# Patient Record
Sex: Male | Born: 1955
Health system: Southern US, Community
[De-identification: ages and names within clinical notes are randomized; demographics above are authoritative.]

## PROBLEM LIST (undated history)

## (undated) ENCOUNTER — Inpatient Hospital Stay: Admission: EM | Payer: Self-pay | Source: Home / Self Care

## (undated) DIAGNOSIS — Z974 Presence of external hearing-aid: Secondary | ICD-10-CM

## (undated) DIAGNOSIS — Z8719 Personal history of other diseases of the digestive system: Secondary | ICD-10-CM

## (undated) DIAGNOSIS — E7800 Pure hypercholesterolemia, unspecified: Secondary | ICD-10-CM

## (undated) DIAGNOSIS — Z87448 Personal history of other diseases of urinary system: Secondary | ICD-10-CM

## (undated) DIAGNOSIS — E039 Hypothyroidism, unspecified: Secondary | ICD-10-CM

## (undated) DIAGNOSIS — K649 Unspecified hemorrhoids: Secondary | ICD-10-CM

## (undated) DIAGNOSIS — Z8619 Personal history of other infectious and parasitic diseases: Secondary | ICD-10-CM

## (undated) DIAGNOSIS — Z973 Presence of spectacles and contact lenses: Secondary | ICD-10-CM

## (undated) DIAGNOSIS — Z8679 Personal history of other diseases of the circulatory system: Secondary | ICD-10-CM

## (undated) DIAGNOSIS — I10 Essential (primary) hypertension: Secondary | ICD-10-CM

---

## 1998-04-04 ENCOUNTER — Ambulatory Visit (HOSPITAL_COMMUNITY): Admission: RE | Admit: 1998-04-04 | Discharge: 1998-04-04 | Payer: Self-pay | Admitting: Orthopedic Surgery

## 2000-06-26 ENCOUNTER — Encounter: Payer: Self-pay | Admitting: *Deleted

## 2000-06-26 ENCOUNTER — Encounter: Admission: RE | Admit: 2000-06-26 | Discharge: 2000-06-26 | Payer: Self-pay | Admitting: *Deleted

## 2002-12-31 ENCOUNTER — Encounter: Admission: RE | Admit: 2002-12-31 | Discharge: 2002-12-31 | Payer: Self-pay | Admitting: Family Medicine

## 2002-12-31 ENCOUNTER — Encounter: Payer: Self-pay | Admitting: Family Medicine

## 2003-11-13 DIAGNOSIS — Z8719 Personal history of other diseases of the digestive system: Secondary | ICD-10-CM

## 2003-11-13 HISTORY — DX: Personal history of other diseases of the digestive system: Z87.19

## 2003-11-14 ENCOUNTER — Encounter: Payer: Self-pay | Admitting: Emergency Medicine

## 2003-11-14 ENCOUNTER — Inpatient Hospital Stay (HOSPITAL_COMMUNITY): Admission: EM | Admit: 2003-11-14 | Discharge: 2003-11-25 | Payer: Self-pay | Admitting: Emergency Medicine

## 2003-11-22 ENCOUNTER — Encounter (INDEPENDENT_AMBULATORY_CARE_PROVIDER_SITE_OTHER): Payer: Self-pay | Admitting: *Deleted

## 2005-02-23 ENCOUNTER — Encounter (INDEPENDENT_AMBULATORY_CARE_PROVIDER_SITE_OTHER): Payer: Self-pay | Admitting: *Deleted

## 2005-02-23 ENCOUNTER — Ambulatory Visit (HOSPITAL_COMMUNITY): Admission: RE | Admit: 2005-02-23 | Discharge: 2005-02-23 | Payer: Self-pay | Admitting: *Deleted

## 2005-10-14 ENCOUNTER — Emergency Department (HOSPITAL_COMMUNITY): Admission: EM | Admit: 2005-10-14 | Discharge: 2005-10-14 | Payer: Self-pay | Admitting: *Deleted

## 2006-05-02 ENCOUNTER — Encounter: Admission: RE | Admit: 2006-05-02 | Discharge: 2006-05-02 | Payer: Self-pay | Admitting: Family Medicine

## 2010-10-21 ENCOUNTER — Emergency Department (HOSPITAL_COMMUNITY)
Admission: EM | Admit: 2010-10-21 | Discharge: 2010-10-21 | Payer: Self-pay | Source: Home / Self Care | Admitting: Emergency Medicine

## 2011-03-23 NOTE — H&P (Signed)
NAME:  Lonnie Jackson, Lonnie Jackson                         ACCOUNT NO.:  [id number]   MEDICAL RECORD NO.:  [id number]                   PATIENT TYPE:   LOCATION:                                       FACILITY:   PHYSICIAN:  Candyce Churn, M.D.          DATE OF BIRTH:  Feb 19, 1956   DATE OF ADMISSION:  11/14/2003  DATE OF DISCHARGE:                                HISTORY & PHYSICAL   CHIEF COMPLAINT:  Abdominal pain and nausea and vomiting.   HISTORY OF PRESENT ILLNESS:  Lonnie Jackson is a pleasant 55 year old male with a  sudden onset of nausea and vomiting yesterday a.m. following a meal at  [facility].  He began having mid abdominal pain.  He went to [facility] who  referred him on to [facility] where he spent last night in the  emergency room being treated with narcotics and antiemetics and was felt to  likely have a gastroenteritis.  Abdominal ultrasound revealed no evidence of  gallstones or cholecystitis by Dr. Pati Gallo ER report.  Abdominal ultrasound  is not currently available.  His lipase was normal during that evaluation on  November 13, 2003.  He was sent home in improved condition but on returning  home he developed more abdominal pain and vomiting and returned to the  [facility] Emergency Room where his evaluation revealed a lipase now  elevated at 85 and amylase of 185.  He is admitted now to treat and further  workup pancreatitis.   ALLERGIES:  None.   MEDICATIONS:  None.   PAST MEDICAL HISTORY:  1. Hepatitis C infection secondary to IV drug use more than 20 years ago.     He began a 6 months of therapy in December 2001 with interferon and     ribavirin and has had clearing of his virus by history.  Dr. Roosvelt Harps assisted with this therapy.  2. Tobacco abuse for 30 years now.  He stopped 4 months ago.   PAST SURGICAL HISTORY:  None.   FAMILY HISTORY:  Hypertension, diabetes, and heart disease in his mother and  he never knew his father.  Twin sister  is healthy and he has a brother with  hypertension and asthma.   SOCIAL HISTORY:  The patient is married, has 2 children from a previous  marriage.  His wife, Sonny Masters, is very supportive and can be reached at home,  phone [phone].  He works at a [employer].   HABITS:  No alcohol use.  IV drug use more than 20 years ago but none since  and he stopped tobacco 4 months ago after 30 years of use.   REVIEW OF SYSTEMS:  Denies diarrhea, bright red blood per rectum, melena, or  coffee-ground emesis.  He has had some chills.  He does not have a history  of renal calculi or gallstones.  Denies headache, chest pain, shortness of  breath.  He does have bilateral flank pain left greater than right and  diffuse abdominal pain.  Denies hematuria.  His neck is not stiff or  painful.  He has never had a similar syndrome.   PHYSICAL EXAMINATION:  GENERAL:  A middle-age male sedated from Dilaudid.  He complains of pain across his mid abdomen to his back and both flanks.  VITAL SIGNS:  His blood pressure is elevated at 149/102, temperature 97.4,  respiratory rate 20 and nonlabored.  O2 saturation 96% on room air.  HEENT:  Reveals a parched mouth.  NECK:  Supple.  No obvious JVD or thyromegaly.  CHEST:  Clear to auscultation.  CARDIAC:  Regular rhythm without murmurs or gallops.  ABDOMEN:  Soft with guarding to palpation diffusely.  More tender in the  lateral abdomen bilaterally.  He has percussion pain in the left flank  slightly greater than right flank.  His bowel sounds are diminished.  There  is no obvious rebound.  RECTAL:  Reveals no stool present.  Prostate is mildly enlarged but  nontender and non-nodular.  EXTREMITIES:  Without cyanosis, clubbing, or edema.  NEUROLOGICAL:  Reveals him to be oriented x3, nonfocal.  SKIN:  Without rashes.   Abdominal and pelvic CT scan performed in the [facility] Emergency Room on  November 14, 2003 reveals some left perinephric edema and an  enlarged  prostate, otherwise no obvious abnormalities.  Abdominal ultrasound from  November 13, 2003 per Dr. Pati Gallo note is reported as negative gallbladder.  White cell count is 11,700, 80% polys.  Hemoglobin 15.3, platelet count  180,000.  Sodium 135, potassium 4.2, chloride 102, bicarb 29, BUN 15,  creatinine 1.4, glucose 101, alk phos is 45 and was 46 yesterday, SGOT is  115 and was 81 yesterday, SGPT is 160 and was 122 yesterday, total bilirubin  is 1, amylase is 185 and lipase is 85 and was 18 on November 13, 2003.  Urinalysis has 15 ketones, protein 100, rare bacteria, 0-2 red cells and  white cells, bilirubin is positive.   ASSESSMENT:  A 55 year old male with 36 hours of abdominal pain and labs  have indicated pancreatitis and mild hepatitis.  His CT suggests left  perinephric inflammation.  I wonder if he may have passed a gallstone to  cause pancreatitis, (?) I doubt pyelonephritis with 0-2 white cells in his  urine.  Prostate is nontender.  I wonder if he may have  hypertriglyceridemia.  Serum not reported as lipemic, however.  On no  medications.  Does not have left shift to suggest bacterial process as yet.  Perinephric edema may be secondary to pancreatitis.   PLAN:  1. Admit and hydrate.  2. Treat with IV Dilaudid and Phenergan as needed to control pain and nausea     and vomiting.  3. Repeat labs in the a.m.  4. Have Dr. Luther Parody to follow.  5. NG suction if nausea and vomiting is severe.  6. Monitor O2 saturation and follow temperature and CBC.  7. Culture urine and blood culture x2 if temperature is greater than or     equal to 101 degrees.                                               Candyce Churn, M.D.    RNG/MEDQ  D:  11/14/2003  T:  11/14/2003  Job:  [id number]   cc:   Donia Guiles, M.D.  [address] [place]  [place] [address]  Fax: [phone]   Althea Grimmer. Luther Parody, M.D.  [address], [address]  [place]  [place] [address] Fax:  [phone]   Melissa L. Ladona Ridgel, MD  [address] [place], [place] [address]  Fax: [phone]
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
## 2011-03-23 NOTE — Consult Note (Signed)
Lonnie Jackson, Lonnie Jackson                         ACCOUNT NO.:  [id number]   MEDICAL RECORD NO.:  [id number]                   PATIENT TYPE:  INP   LOCATION:  0477                                 FACILITY:  [facility]   PHYSICIAN:  Bertram Millard. Dahlstedt, M.D.          DATE OF BIRTH:  May 18, 1956   DATE OF CONSULTATION:  11/20/2003  DATE OF DISCHARGE:                                   CONSULTATION   REASON FOR CONSULTATION:  Left renal infarct.   BRIEF HISTORY:  This 55 year old male with no significant past medical  history was admitted on the 9th of this month.  He had a 24-hour history of  sudden onset of nausea and vomiting as well as lower abdominal pain.  The  patient initially was sent home from the emergency room with presumed  gastroenteritis.  The pain and nausea and vomiting continued and he returned  for a further evaluation and management.  At first, he was thought to have  possible pancreatitis or pyelonephritis based on some perinephric and  peripancreatic inflammation.  His lipase and amylase were 85 and 185,  respectively.   Since that time, follow up CT scan revealed the patient to have infarcts in  the left kidney.  He has had basically resolution of his left flank pain and  nausea and vomiting.  He recently had a CT of his head, performed today, due  to a month long history if headache.  The headache was usually worse in the  middle of the night.  He took ibuprofen for that.   The patient does have an intermittent history of gross hematuria.  He says  this is mainly terminal in nature.  He denies passing blood clots.  He  denies any prior flank pain, history of kidney stones, prostate, bladder or  kidney infections.  His only GU history dates back to a vasectomy several  years ago.   The patient had a past medical history significant for hepatitis C infection  due to IV drug abuse.  He quit use more than 20 years ago.  He sees Althea Grimmer.  Luther Parody, M.D. for that.   He  is not on any current medications.   There are no known drug allergies.   FAMILY HISTORY:  Negative for prostate cancer.  Is positive for heart  disease, diabetes, and hypertension.   PHYSICAL EXAMINATION:  GENERAL:  A pleasant, healthy-appearing, robust adult  male.  LYMPHATICS:  He had no supraclavicular adenopathy.  CHEST:  Lungs were clear to auscultation bilaterally.  HEART:  Regular rate and rhythm.  No rubs, gallops, or murmurs.  ABDOMEN:  Protuberant, soft, with mild left CVA and left upper quadrant  tenderness.  He had no hepatosplenomegaly or masses.  Bowel sounds were  present.  There was no rebound or guarding.  There were no inguinal hernias.  GENITOURINARY:  Phallus was circumcised.  There were several condylomata  present on the shaft  of the penis.  Glans was normal, meatus normal location  and size without blood or disease.  Scrotal skin unremarkable.  Testicles  were descended bilaterally.  No nodules or tenderness.  Cords and epididymal  structures were normal.  RECTAL:  Normal perianal and perineal skin.  Normal anal sphincter tone.  Gland 2+ in size.  No tenderness, no nodularity, no rectal masses.  Seminal  vesicles nonpalpable.   IMPRESSION:  1. Renal infarct.  The etiology undetermined.  I would be suspicious for     embolic events, the patient undergoing evaluation for that right now.     Would also rule out any type of vasculitis sort of process going on.  2. Hematuria, etiology undetermined.  CT reveals no renal lesions except for     recent infarct.  Will need a lower tract evaluation down the road.  Can     do this in the office with flexible cystoscopy.  3. Genital condylomata.  The patient desires treatment.  We will freeze     these in the office on followup.   Will follow this patient during his hospitalization.                                               Bertram Millard. Dahlstedt, M.D.    SMD/MEDQ  D:  11/20/2003  T:  11/20/2003  Job:  [id number]    cc:   Deirdre Peer. Polite, M.D.   Donia Guiles, M.D.  [address] [place]  [place] [address]  Fax: [phone]

## 2014-03-09 ENCOUNTER — Emergency Department (HOSPITAL_COMMUNITY): Payer: 59

## 2014-03-09 ENCOUNTER — Inpatient Hospital Stay (HOSPITAL_COMMUNITY)
Admission: EM | Admit: 2014-03-09 | Discharge: 2014-03-12 | DRG: 026 | Disposition: A | Discharge status: Home / Self Care | Payer: 59 | Attending: General Surgery | Admitting: General Surgery

## 2014-03-09 ENCOUNTER — Emergency Department (HOSPITAL_COMMUNITY): Payer: 59 | Admitting: Anesthesiology

## 2014-03-09 ENCOUNTER — Encounter (HOSPITAL_COMMUNITY): Payer: 59 | Admitting: Anesthesiology

## 2014-03-09 ENCOUNTER — Encounter (HOSPITAL_COMMUNITY): Admission: EM | Disposition: A | Discharge status: Home / Self Care | Payer: Self-pay | Source: Home / Self Care

## 2014-03-09 ENCOUNTER — Encounter (HOSPITAL_COMMUNITY): Payer: Self-pay | Admitting: Emergency Medicine

## 2014-03-09 DIAGNOSIS — G9600 Cerebrospinal fluid leak, unspecified: Secondary | ICD-10-CM | POA: Diagnosis present

## 2014-03-09 DIAGNOSIS — H9190 Unspecified hearing loss, unspecified ear: Secondary | ICD-10-CM | POA: Diagnosis present

## 2014-03-09 DIAGNOSIS — S022XXA Fracture of nasal bones, initial encounter for closed fracture: Secondary | ICD-10-CM

## 2014-03-09 DIAGNOSIS — I10 Essential (primary) hypertension: Secondary | ICD-10-CM | POA: Diagnosis present

## 2014-03-09 DIAGNOSIS — Z79899 Other long term (current) drug therapy: Secondary | ICD-10-CM

## 2014-03-09 DIAGNOSIS — S02400A Malar fracture unspecified, initial encounter for closed fracture: Secondary | ICD-10-CM

## 2014-03-09 DIAGNOSIS — E079 Disorder of thyroid, unspecified: Secondary | ICD-10-CM | POA: Diagnosis present

## 2014-03-09 DIAGNOSIS — S0121XA Laceration without foreign body of nose, initial encounter: Secondary | ICD-10-CM | POA: Diagnosis present

## 2014-03-09 DIAGNOSIS — S0100XA Unspecified open wound of scalp, initial encounter: Secondary | ICD-10-CM | POA: Diagnosis present

## 2014-03-09 DIAGNOSIS — Q019 Encephalocele, unspecified: Secondary | ICD-10-CM

## 2014-03-09 DIAGNOSIS — S01312A Laceration without foreign body of left ear, initial encounter: Secondary | ICD-10-CM | POA: Diagnosis present

## 2014-03-09 DIAGNOSIS — S060X9A Concussion with loss of consciousness of unspecified duration, initial encounter: Secondary | ICD-10-CM

## 2014-03-09 DIAGNOSIS — E7800 Pure hypercholesterolemia, unspecified: Secondary | ICD-10-CM | POA: Diagnosis present

## 2014-03-09 DIAGNOSIS — I609 Nontraumatic subarachnoid hemorrhage, unspecified: Secondary | ICD-10-CM | POA: Diagnosis present

## 2014-03-09 DIAGNOSIS — S06300A Unspecified focal traumatic brain injury without loss of consciousness, initial encounter: Principal | ICD-10-CM

## 2014-03-09 DIAGNOSIS — S01319A Laceration without foreign body of unspecified ear, initial encounter: Secondary | ICD-10-CM

## 2014-03-09 DIAGNOSIS — S066XAA Traumatic subarachnoid hemorrhage with loss of consciousness status unknown, initial encounter: Secondary | ICD-10-CM

## 2014-03-09 DIAGNOSIS — S01309A Unspecified open wound of unspecified ear, initial encounter: Secondary | ICD-10-CM | POA: Diagnosis present

## 2014-03-09 DIAGNOSIS — S0120XA Unspecified open wound of nose, initial encounter: Secondary | ICD-10-CM | POA: Diagnosis present

## 2014-03-09 DIAGNOSIS — T07XXXA Unspecified multiple injuries, initial encounter: Secondary | ICD-10-CM | POA: Diagnosis present

## 2014-03-09 DIAGNOSIS — S02109A Fracture of base of skull, unspecified side, initial encounter for closed fracture: Principal | ICD-10-CM | POA: Diagnosis present

## 2014-03-09 DIAGNOSIS — S0292XA Unspecified fracture of facial bones, initial encounter for closed fracture: Secondary | ICD-10-CM | POA: Diagnosis present

## 2014-03-09 DIAGNOSIS — Z1889 Other specified retained foreign body fragments: Secondary | ICD-10-CM

## 2014-03-09 DIAGNOSIS — R339 Retention of urine, unspecified: Secondary | ICD-10-CM | POA: Diagnosis not present

## 2014-03-09 DIAGNOSIS — S066X9A Traumatic subarachnoid hemorrhage with loss of consciousness of unspecified duration, initial encounter: Secondary | ICD-10-CM

## 2014-03-09 DIAGNOSIS — J342 Deviated nasal septum: Secondary | ICD-10-CM | POA: Diagnosis present

## 2014-03-09 DIAGNOSIS — S02401A Maxillary fracture, unspecified, initial encounter for closed fracture: Secondary | ICD-10-CM

## 2014-03-09 DIAGNOSIS — S060XAA Concussion with loss of consciousness status unknown, initial encounter: Secondary | ICD-10-CM

## 2014-03-09 HISTORY — PX: SINUS ENDO W/FUSION: SHX777

## 2014-03-09 HISTORY — PX: MINOR GRAFT APPLICATION: SHX5978

## 2014-03-09 HISTORY — PX: LACERATION REPAIR: SHX5284

## 2014-03-09 HISTORY — DX: Essential (primary) hypertension: I10

## 2014-03-09 HISTORY — DX: Pure hypercholesterolemia, unspecified: E78.00

## 2014-03-09 LAB — COMPREHENSIVE METABOLIC PANEL
ALBUMIN: 3.9 g/dL (ref 3.5–5.2)
ALK PHOS: 48 U/L (ref 39–117)
ALT: 43 U/L (ref 0–53)
AST: 41 U/L — AB (ref 0–37)
BUN: 20 mg/dL (ref 6–23)
CO2: 23 mEq/L (ref 19–32)
Calcium: 8.5 mg/dL (ref 8.4–10.5)
Chloride: 107 mEq/L (ref 96–112)
Creatinine, Ser: 1.02 mg/dL (ref 0.50–1.35)
GFR calc Af Amer: >90 mL/min (ref >90)
GFR calc non Af Amer: 80 mL/min — ABNORMAL LOW (ref >90)
GLUCOSE: 107 mg/dL — AB (ref 70–99)
POTASSIUM: 3.5 meq/L — AB (ref 3.7–5.3)
SODIUM: 144 meq/L (ref 137–147)
TOTAL PROTEIN: 6.9 g/dL (ref 6.0–8.3)
Total Bilirubin: 0.3 mg/dL (ref 0.3–1.2)

## 2014-03-09 LAB — CBC
HEMATOCRIT: 42 % (ref 39.0–52.0)
Hemoglobin: 15.2 g/dL (ref 13.0–17.0)
MCH: 30.6 pg (ref 26.0–34.0)
MCHC: 36.2 g/dL — AB (ref 30.0–36.0)
MCV: 84.5 fL (ref 78.0–100.0)
Platelets: 121 10*3/uL — ABNORMAL LOW (ref 150–400)
RBC: 4.97 MIL/uL (ref 4.22–5.81)
RDW: 12.9 % (ref 11.5–15.5)
WBC: 6.1 10*3/uL (ref 4.0–10.5)

## 2014-03-09 LAB — SAMPLE TO BLOOD BANK

## 2014-03-09 LAB — PROTIME-INR
INR: 1.02 (ref 0.00–1.49)
Prothrombin Time: 13.2 seconds (ref 11.6–15.2)

## 2014-03-09 SURGERY — MINOR GRAFT APPLICATION
Anesthesia: General | Site: Nose

## 2014-03-09 MED ORDER — SUCCINYLCHOLINE CHLORIDE 20 MG/ML IJ SOLN
INTRAMUSCULAR | Status: AC
  Filled 2014-03-09: qty 1

## 2014-03-09 MED ORDER — CLINDAMYCIN PHOSPHATE 600 MG/50ML IV SOLN
600.0000 mg | Freq: Once | INTRAVENOUS | Status: AC
  Administered 2014-03-09: 600 mg via INTRAVENOUS
  Filled 2014-03-09: qty 50

## 2014-03-09 MED ORDER — PROMETHAZINE HCL 25 MG/ML IJ SOLN
12.5000 mg | INTRAMUSCULAR | Status: DC | PRN
  Administered 2014-03-09 – 2014-03-10 (×2): 12.5 mg via INTRAVENOUS
  Filled 2014-03-09: qty 1

## 2014-03-09 MED ORDER — ONDANSETRON HCL 4 MG/2ML IJ SOLN: 4.0000 mg | Freq: Once | INTRAMUSCULAR | Status: DC

## 2014-03-09 MED ORDER — ONDANSETRON HCL 4 MG/2ML IJ SOLN
4.0000 mg | Freq: Once | INTRAMUSCULAR | Status: AC
  Administered 2014-03-09: 4 mg via INTRAVENOUS

## 2014-03-09 MED ORDER — TETANUS-DIPHTH-ACELL PERTUSSIS 5-2.5-18.5 LF-MCG/0.5 IM SUSP
0.5000 mL | Freq: Once | INTRAMUSCULAR | Status: AC
  Administered 2014-03-09: 0.5 mL via INTRAMUSCULAR

## 2014-03-09 MED ORDER — OXYMETAZOLINE HCL 0.05 % NA SOLN
NASAL | Status: AC
  Filled 2014-03-09: qty 15

## 2014-03-09 MED ORDER — LIDOCAINE HCL (CARDIAC) 20 MG/ML IV SOLN
INTRAVENOUS | Status: AC
  Filled 2014-03-09: qty 5

## 2014-03-09 MED ORDER — MORPHINE SULFATE 2 MG/ML IJ SOLN
2.0000 mg | Freq: Once | INTRAMUSCULAR | Status: AC
  Administered 2014-03-09: 2 mg via INTRAVENOUS

## 2014-03-09 MED ORDER — ARTIFICIAL TEARS OP OINT
TOPICAL_OINTMENT | OPHTHALMIC | Status: AC
  Filled 2014-03-09: qty 3.5

## 2014-03-09 MED ORDER — PROPOFOL 10 MG/ML IV BOLUS
INTRAVENOUS | Status: AC
  Filled 2014-03-09: qty 20

## 2014-03-09 MED ORDER — ALBUMIN HUMAN 5 % IV SOLN
INTRAVENOUS | Status: DC | PRN
  Administered 2014-03-09 – 2014-03-10 (×2): via INTRAVENOUS

## 2014-03-09 MED ORDER — VECURONIUM BROMIDE 10 MG IV SOLR
INTRAVENOUS | Status: DC | PRN
  Administered 2014-03-09: 4 mg via INTRAVENOUS
  Administered 2014-03-10 (×2): 3 mg via INTRAVENOUS
  Administered 2014-03-10: 1 mg via INTRAVENOUS

## 2014-03-09 MED ORDER — MORPHINE SULFATE 4 MG/ML IJ SOLN: 4.0000 mg | Freq: Once | INTRAMUSCULAR | Status: DC

## 2014-03-09 MED ORDER — PROPOFOL 10 MG/ML IV BOLUS
INTRAVENOUS | Status: DC | PRN
  Administered 2014-03-09: 100 mg via INTRAVENOUS

## 2014-03-09 MED ORDER — SODIUM CHLORIDE 0.9 % IV SOLN
INTRAVENOUS | Status: DC | PRN
  Administered 2014-03-09: 23:00:00 via INTRAVENOUS

## 2014-03-09 MED ORDER — ONDANSETRON HCL 4 MG/2ML IJ SOLN
INTRAMUSCULAR | Status: AC
Start: 2014-03-09 — End: 2014-03-10
  Filled 2014-03-09: qty 2

## 2014-03-09 MED ORDER — MUPIROCIN 2 % EX OINT
TOPICAL_OINTMENT | CUTANEOUS | Status: AC
  Filled 2014-03-09: qty 22

## 2014-03-09 MED ORDER — LIDOCAINE HCL (CARDIAC) 20 MG/ML IV SOLN
INTRAVENOUS | Status: DC | PRN
  Administered 2014-03-09: 100 mg via INTRAVENOUS

## 2014-03-09 MED ORDER — HYDROMORPHONE HCL PF 1 MG/ML IJ SOLN
INTRAMUSCULAR | Status: AC
  Filled 2014-03-09: qty 1

## 2014-03-09 MED ORDER — ARTIFICIAL TEARS OP OINT
TOPICAL_OINTMENT | OPHTHALMIC | Status: DC | PRN
  Administered 2014-03-09: 1 via OPHTHALMIC

## 2014-03-09 MED ORDER — FENTANYL CITRATE 0.05 MG/ML IJ SOLN
INTRAMUSCULAR | Status: DC | PRN
  Administered 2014-03-09: 150 ug via INTRAVENOUS
  Administered 2014-03-10: 100 ug via INTRAVENOUS

## 2014-03-09 MED ORDER — EPHEDRINE SULFATE 50 MG/ML IJ SOLN
INTRAMUSCULAR | Status: DC | PRN
  Administered 2014-03-09 – 2014-03-10 (×2): 5 mg via INTRAVENOUS
  Administered 2014-03-10 (×2): 10 mg via INTRAVENOUS

## 2014-03-09 MED ORDER — TETANUS-DIPHTH-ACELL PERTUSSIS 5-2.5-18.5 LF-MCG/0.5 IM SUSP
INTRAMUSCULAR | Status: AC
  Filled 2014-03-09: qty 0.5

## 2014-03-09 MED ORDER — FENTANYL CITRATE 0.05 MG/ML IJ SOLN
INTRAMUSCULAR | Status: AC
  Filled 2014-03-09: qty 5

## 2014-03-09 MED ORDER — MORPHINE SULFATE 2 MG/ML IJ SOLN
INTRAMUSCULAR | Status: AC
  Filled 2014-03-09: qty 2

## 2014-03-09 MED ORDER — STERILE WATER FOR INJECTION IJ SOLN
INTRAMUSCULAR | Status: AC
  Filled 2014-03-09: qty 10

## 2014-03-09 MED ORDER — VECURONIUM BROMIDE 10 MG IV SOLR
INTRAVENOUS | Status: AC
  Filled 2014-03-09: qty 10

## 2014-03-09 MED ORDER — MIDAZOLAM HCL 2 MG/2ML IJ SOLN
INTRAMUSCULAR | Status: AC
  Filled 2014-03-09: qty 2

## 2014-03-09 MED ORDER — SUCCINYLCHOLINE CHLORIDE 20 MG/ML IJ SOLN
INTRAMUSCULAR | Status: DC | PRN
  Administered 2014-03-09: 120 mg via INTRAVENOUS

## 2014-03-09 MED ORDER — HYDROMORPHONE HCL PF 1 MG/ML IJ SOLN
1.0000 mg | Freq: Once | INTRAMUSCULAR | Status: AC
  Administered 2014-03-09: 1 mg via INTRAVENOUS

## 2014-03-09 MED ORDER — PHENYLEPHRINE 40 MCG/ML (10ML) SYRINGE FOR IV PUSH (FOR BLOOD PRESSURE SUPPORT)
PREFILLED_SYRINGE | INTRAVENOUS | Status: AC
  Filled 2014-03-09: qty 10

## 2014-03-09 MED ORDER — ONDANSETRON HCL 4 MG/2ML IJ SOLN
INTRAMUSCULAR | Status: AC
  Filled 2014-03-09: qty 2

## 2014-03-09 SURGICAL SUPPLY — 61 items
ADH SKN CLS APL DERMABOND .7 (GAUZE/BANDAGES/DRESSINGS) ×4
ATTRACTOMAT 16X20 MAGNETIC DRP (DRAPES) IMPLANT
BLADE 10 SAFETY STRL DISP (BLADE) ×5 IMPLANT
BLADE RAD 40 CVD SINUS 4MM (BLADE) IMPLANT
BLADE RAD60 ROTATE M4 4 5PK (BLADE) IMPLANT
BLADE ROTATE RAD 40 4 M4 (BLADE) ×5 IMPLANT
BLADE ROTATE TRICUT 4X13 M4 (BLADE) ×5 IMPLANT
BUR DIAMOND 13X5 70D (BURR) IMPLANT
BUR DIAMOND CURV 15X5 15D (BURR) IMPLANT
CANISTER SUCTION 2500CC (MISCELLANEOUS) ×5 IMPLANT
COAGULATOR SUCT SWTCH 10FR 6 (ELECTROSURGICAL) ×5 IMPLANT
CONT SPEC 4OZ CLIKSEAL STRL BL (MISCELLANEOUS) IMPLANT
CORDS BIPOLAR (ELECTRODE) ×5 IMPLANT
CRADLE DONUT ADULT HEAD (MISCELLANEOUS) IMPLANT
DECANTER SPIKE VIAL GLASS SM (MISCELLANEOUS) ×5 IMPLANT
DERMABOND ADVANCED (GAUZE/BANDAGES/DRESSINGS) ×1
DERMABOND ADVANCED .7 DNX12 (GAUZE/BANDAGES/DRESSINGS) ×1 IMPLANT
DRESSING NASAL POPE 10X1.5X2.5 (GAUZE/BANDAGES/DRESSINGS) IMPLANT
DRESSING TELFA 8X3 (GAUZE/BANDAGES/DRESSINGS) ×2 IMPLANT
DRSG NASAL POPE 10X1.5X2.5 (GAUZE/BANDAGES/DRESSINGS)
DRSG NASOPORE 8CM (GAUZE/BANDAGES/DRESSINGS) ×2 IMPLANT
DURASEAL SPINE SEALANT 3ML (MISCELLANEOUS) ×2 IMPLANT
ELECT COATED BLADE 2.86 ST (ELECTRODE) ×5 IMPLANT
ELECT REM PT RETURN 9FT ADLT (ELECTROSURGICAL)
ELECTRODE REM PT RTRN 9FT ADLT (ELECTROSURGICAL) IMPLANT
FILTER ARTHROSCOPY CONVERTOR (FILTER) ×10 IMPLANT
FLOSEAL 10ML (HEMOSTASIS) IMPLANT
GLOVE SURG SS PI 7.5 STRL IVOR (GLOVE) ×5 IMPLANT
GOWN STRL REUS W/ TWL LRG LVL3 (GOWN DISPOSABLE) ×8 IMPLANT
GOWN STRL REUS W/TWL LRG LVL3 (GOWN DISPOSABLE) ×10
HEMOSTAT SURGICEL 2X14 (HEMOSTASIS) ×4 IMPLANT
KIT BASIN OR (CUSTOM PROCEDURE TRAY) ×5 IMPLANT
KIT ROOM TURNOVER OR (KITS) ×5 IMPLANT
NDL SPNL 20GX3.5 QUINCKE YW (NEEDLE) ×3 IMPLANT
NEEDLE SPNL 20GX3.5 QUINCKE YW (NEEDLE) ×5 IMPLANT
NS IRRIG 1000ML POUR BTL (IV SOLUTION) ×5 IMPLANT
PAD ARMBOARD 7.5X6 YLW CONV (MISCELLANEOUS) ×10 IMPLANT
PAD ENT ADHESIVE 25PK (MISCELLANEOUS) ×5 IMPLANT
PATTIES SURGICAL .5 X3 (DISPOSABLE) ×5 IMPLANT
PEN SKIN MARKING BROAD (MISCELLANEOUS) IMPLANT
SHEATH ENDOSCRUB 0 DEG (SHEATH) ×5 IMPLANT
SHEATH ENDOSCRUB 30 DEG (SHEATH) IMPLANT
SHEATH ENDOSCRUB 45 DEG (SHEATH) ×7 IMPLANT
SOLUTION ANTI FOG 6CC (MISCELLANEOUS) ×5 IMPLANT
SPECIMEN JAR SMALL (MISCELLANEOUS) ×10 IMPLANT
STRIP CLOSURE SKIN 1/2X4 (GAUZE/BANDAGES/DRESSINGS) ×5 IMPLANT
SURGIFLO W/THROMBIN 8M KIT (HEMOSTASIS) ×2 IMPLANT
SUT ETHILON 3 0 PS 1 (SUTURE) IMPLANT
SUT SILK 2 0 SH (SUTURE) IMPLANT
SWAB COLLECTION DEVICE MRSA (MISCELLANEOUS) IMPLANT
SYR 50ML SLIP (SYRINGE) IMPLANT
SYRINGE 10CC LL (SYRINGE) ×5 IMPLANT
TOWEL OR 17X24 6PK STRL BLUE (TOWEL DISPOSABLE) ×5 IMPLANT
TOWEL OR 17X26 10 PK STRL BLUE (TOWEL DISPOSABLE) ×5 IMPLANT
TRACKER ENT INSTRUMENT (MISCELLANEOUS) ×5 IMPLANT
TRACKER ENT PATIENT (MISCELLANEOUS) ×5 IMPLANT
TRAY ENT MC OR (CUSTOM PROCEDURE TRAY) ×5 IMPLANT
TUBE CONNECTING 12X1/4 (SUCTIONS) ×5 IMPLANT
TUBING STRAIGHTSHOT EPS 5PK (TUBING) ×5 IMPLANT
WATER STERILE IRR 1000ML POUR (IV SOLUTION) ×5 IMPLANT
WIPE INSTRUMENT VISIWIPE 73X73 (MISCELLANEOUS) ×5 IMPLANT
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
## 2014-03-09 NOTE — ED Notes (Signed)
Pt continues to episodes of bradycardia, into the high 30s and low 40s, resolves when awoke from sleep. BP is stable. Mental status remains the same
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
## 2014-03-09 NOTE — Anesthesia Preprocedure Evaluation (Addendum)
Anesthesia Evaluation  Patient identified by MRN, date of birth, ID band Patient awake    Reviewed: Allergy & Precautions, H&P , NPO status , Patient's Chart, lab work & pertinent test results  History of Anesthesia Complications Negative for: history of anesthetic complications  Airway Mallampati: II      Dental  (+) Dental Advisory Given, Teeth Intact   Pulmonary former smoker,    Pulmonary exam normal       Cardiovascular hypertension, Pt. on medications and Pt. on home beta blockers     Neuro/Psych negative neurological ROS  negative psych ROS   GI/Hepatic negative GI ROS, (+) Hepatitis -, C  Endo/Other  negative endocrine ROS  Renal/GU negative Renal ROS     Musculoskeletal   Abdominal   Peds  Hematology   Anesthesia Other Findings   Reproductive/Obstetrics                         Anesthesia Physical Anesthesia Plan  ASA: III and emergent  Anesthesia Plan: General   Post-op Pain Management:    Induction: Intravenous, Rapid sequence and Cricoid pressure planned  Airway Management Planned: Oral ETT  Additional Equipment:   Intra-op Plan:   Post-operative Plan: Extubation in OR and Possible Post-op intubation/ventilation  Informed Consent: I have reviewed the patients History and Physical, chart, labs and discussed the procedure including the risks, benefits and alternatives for the proposed anesthesia with the patient or authorized representative who has indicated his/her understanding and acceptance.   Dental advisory given  Plan Discussed with: Anesthesiologist, Surgeon and CRNA  Anesthesia Plan Comments:        Anesthesia Quick Evaluation

## 2014-03-09 NOTE — ED Notes (Signed)
Family at beside. Family given emotional support. 

## 2014-03-09 NOTE — Progress Notes (Signed)
Orthopedic Tech Progress Note Patient Details:  Lonnie Jackson Nov 07, 1955 [id number]  Patient ID: Lonnie Jackson, male   DOB: [birth year], 58 y.o.   MRN: [id number] Made level 2 trauma visit  Lonnie Jackson 03/09/2014, 7:25 PM

## 2014-03-09 NOTE — Anesthesia Procedure Notes (Signed)
Procedure Name: Intubation Date/Time: 03/09/2014 11:31 PM Performed by: Vaughan Browner Pre-anesthesia Checklist: Patient identified, Emergency Drugs available, Suction available and Patient being monitored Patient Re-evaluated:Patient Re-evaluated prior to inductionOxygen Delivery Method: Circle system utilized Preoxygenation: Pre-oxygenation with 100% oxygen Intubation Type: IV induction, Rapid sequence and Cricoid Pressure applied Laryngoscope Size: Mac and 3 Grade View: Grade II Tube type: Oral Tube size: 7.5 mm Number of attempts: 1 Airway Equipment and Method: Stylet Placement Confirmation: ETT inserted through vocal cords under direct vision,  positive ETCO2 and breath sounds checked- equal and bilateral Secured at: 23 cm Tube secured with: Tape Dental Injury: Teeth and Oropharynx as per pre-operative assessment  Comments: Head/neck maintained in neutral position throughout induction and DVL.  Front of collar removed for DVL and then replaced after ETT placement confirmed.
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
## 2014-03-10 LAB — CBC
HCT: 39.6 % (ref 39.0–52.0)
HEMOGLOBIN: 13.5 g/dL (ref 13.0–17.0)
MCH: 29.7 pg (ref 26.0–34.0)
MCHC: 34.1 g/dL (ref 30.0–36.0)
MCV: 87.2 fL (ref 78.0–100.0)
Platelets: 109 10*3/uL — ABNORMAL LOW (ref 150–400)
RBC: 4.54 MIL/uL (ref 4.22–5.81)
RDW: 13.2 % (ref 11.5–15.5)
WBC: 8.4 10*3/uL (ref 4.0–10.5)

## 2014-03-10 LAB — BASIC METABOLIC PANEL
BUN: 20 mg/dL (ref 6–23)
CHLORIDE: 102 meq/L (ref 96–112)
CO2: 25 mEq/L (ref 19–32)
CREATININE: 1.01 mg/dL (ref 0.50–1.35)
Calcium: 8.7 mg/dL (ref 8.4–10.5)
GFR calc Af Amer: >90 mL/min (ref >90)
GFR, EST NON AFRICAN AMERICAN: 81 mL/min — AB (ref >90)
GLUCOSE: 186 mg/dL — AB (ref 70–99)
POTASSIUM: 5.1 meq/L (ref 3.7–5.3)
Sodium: 142 mEq/L (ref 137–147)

## 2014-03-10 LAB — GLUCOSE, CAPILLARY
Glucose-Capillary: 196 mg/dL — ABNORMAL HIGH (ref 70–99)
Glucose-Capillary: 183 mg/dL — ABNORMAL HIGH (ref 70–99)
Glucose-Capillary: 146 mg/dL — ABNORMAL HIGH (ref 70–99)

## 2014-03-10 MED ORDER — HYDROMORPHONE HCL PF 1 MG/ML IJ SOLN
INTRAMUSCULAR | Status: AC
  Filled 2014-03-10: qty 1

## 2014-03-10 MED ORDER — HYDROMORPHONE HCL PF 1 MG/ML IJ SOLN
0.2500 mg | INTRAMUSCULAR | Status: DC | PRN
  Administered 2014-03-10: 0.25 mg via INTRAVENOUS

## 2014-03-10 MED ORDER — ALBUTEROL SULFATE HFA 108 (90 BASE) MCG/ACT IN AERS
INHALATION_SPRAY | RESPIRATORY_TRACT | Status: DC | PRN
  Administered 2014-03-10 (×2): 2 via RESPIRATORY_TRACT

## 2014-03-10 MED ORDER — VECURONIUM BROMIDE 10 MG IV SOLR
INTRAVENOUS | Status: AC
  Filled 2014-03-10: qty 10

## 2014-03-10 MED ORDER — ONDANSETRON HCL 4 MG/2ML IJ SOLN: 4.0000 mg | Freq: Four times a day (QID) | INTRAMUSCULAR | Status: DC | PRN

## 2014-03-10 MED ORDER — BACITRACIN ZINC 500 UNIT/GM EX OINT
TOPICAL_OINTMENT | Freq: Two times a day (BID) | CUTANEOUS | Status: DC
  Administered 2014-03-10: 1 via TOPICAL
  Administered 2014-03-10 – 2014-03-11 (×3): via TOPICAL
  Filled 2014-03-10 (×2): qty 28.35

## 2014-03-10 MED ORDER — PROMETHAZINE HCL 25 MG/ML IJ SOLN
INTRAMUSCULAR | Status: AC
Start: 2014-03-10 — End: 2014-03-10
  Filled 2014-03-10: qty 1

## 2014-03-10 MED ORDER — PHENYLEPHRINE HCL 10 MG/ML IJ SOLN
INTRAMUSCULAR | Status: DC | PRN
  Administered 2014-03-10 (×4): 40 ug via INTRAVENOUS

## 2014-03-10 MED ORDER — ALBUTEROL SULFATE HFA 108 (90 BASE) MCG/ACT IN AERS
INHALATION_SPRAY | RESPIRATORY_TRACT | Status: AC
  Filled 2014-03-10: qty 6.7

## 2014-03-10 MED ORDER — NEOSTIGMINE METHYLSULFATE 10 MG/10ML IV SOLN
INTRAVENOUS | Status: DC | PRN
  Administered 2014-03-10: 4 mg via INTRAVENOUS

## 2014-03-10 MED ORDER — OXYCODONE HCL 5 MG PO TABS: 5.0000 mg | ORAL_TABLET | Freq: Once | ORAL | Status: DC | PRN

## 2014-03-10 MED ORDER — MORPHINE SULFATE 2 MG/ML IJ SOLN
1.0000 mg | INTRAMUSCULAR | Status: DC | PRN
  Administered 2014-03-10: 4 mg via INTRAVENOUS
  Administered 2014-03-10 (×2): 2 mg via INTRAVENOUS
  Administered 2014-03-10 – 2014-03-11 (×6): 4 mg via INTRAVENOUS
  Administered 2014-03-11: 2 mg via INTRAVENOUS
  Administered 2014-03-11: 4 mg via INTRAVENOUS
  Administered 2014-03-12: 2 mg via INTRAVENOUS
  Administered 2014-03-12: 1 mg via INTRAVENOUS
  Administered 2014-03-12: 4 mg via INTRAVENOUS
  Filled 2014-03-10: qty 1
  Filled 2014-03-10: qty 2
  Filled 2014-03-10: qty 1
  Filled 2014-03-10 (×5): qty 2
  Filled 2014-03-10: qty 1
  Filled 2014-03-10 (×2): qty 2
  Filled 2014-03-10: qty 1
  Filled 2014-03-10: qty 2
  Filled 2014-03-10: qty 1

## 2014-03-10 MED ORDER — HEMOSTATIC AGENTS (NO CHARGE) OPTIME
TOPICAL | Status: DC | PRN
  Administered 2014-03-10: 1 via TOPICAL

## 2014-03-10 MED ORDER — NEOSTIGMINE METHYLSULFATE 10 MG/10ML IV SOLN
INTRAVENOUS | Status: AC
  Filled 2014-03-10: qty 1

## 2014-03-10 MED ORDER — MUPIROCIN 2 % EX OINT
TOPICAL_OINTMENT | Freq: Three times a day (TID) | CUTANEOUS | Status: DC
  Filled 2014-03-10: qty 22

## 2014-03-10 MED ORDER — DEXAMETHASONE SODIUM PHOSPHATE 10 MG/ML IJ SOLN
INTRAMUSCULAR | Status: AC
  Filled 2014-03-10: qty 1

## 2014-03-10 MED ORDER — MUPIROCIN CALCIUM 2 % NA OINT
TOPICAL_OINTMENT | NASAL | Status: DC | PRN
  Administered 2014-03-10: 1 via NASAL

## 2014-03-10 MED ORDER — GLYCOPYRROLATE 0.2 MG/ML IJ SOLN
INTRAMUSCULAR | Status: DC | PRN
  Administered 2014-03-10: 0.6 mg via INTRAVENOUS

## 2014-03-10 MED ORDER — HEMOSTATIC AGENTS (NO CHARGE) OPTIME
TOPICAL | Status: DC | PRN
  Administered 2014-03-10: 2 via TOPICAL

## 2014-03-10 MED ORDER — GLYCOPYRROLATE 0.2 MG/ML IJ SOLN
INTRAMUSCULAR | Status: AC
  Filled 2014-03-10: qty 3

## 2014-03-10 MED ORDER — SODIUM CHLORIDE 0.9 % IR SOLN
Status: DC | PRN
  Administered 2014-03-10: 1000 mL

## 2014-03-10 MED ORDER — LACTATED RINGERS IV SOLN
INTRAVENOUS | Status: DC | PRN
  Administered 2014-03-10: via INTRAVENOUS

## 2014-03-10 MED ORDER — SIMVASTATIN 20 MG PO TABS
20.0000 mg | ORAL_TABLET | Freq: Every day | ORAL | Status: DC
  Administered 2014-03-10 – 2014-03-11 (×2): 20 mg via ORAL
  Filled 2014-03-10 (×3): qty 1

## 2014-03-10 MED ORDER — ONDANSETRON HCL 4 MG PO TABS: 4.0000 mg | ORAL_TABLET | Freq: Four times a day (QID) | ORAL | Status: DC | PRN

## 2014-03-10 MED ORDER — KCL IN DEXTROSE-NACL 20-5-0.9 MEQ/L-%-% IV SOLN
INTRAVENOUS | Status: DC
  Administered 2014-03-10 – 2014-03-12 (×3): via INTRAVENOUS
  Filled 2014-03-10 (×5): qty 1000

## 2014-03-10 MED ORDER — DEXAMETHASONE SODIUM PHOSPHATE 4 MG/ML IJ SOLN
INTRAMUSCULAR | Status: DC | PRN
  Administered 2014-03-10: 4 mg via INTRAVENOUS

## 2014-03-10 MED ORDER — ATENOLOL 25 MG PO TABS
25.0000 mg | ORAL_TABLET | Freq: Every morning | ORAL | Status: DC
  Administered 2014-03-10 – 2014-03-11 (×2): 25 mg via ORAL
  Filled 2014-03-10 (×3): qty 1

## 2014-03-10 MED ORDER — OXYMETAZOLINE HCL 0.05 % NA SOLN
NASAL | Status: DC | PRN
  Administered 2014-03-10: 1

## 2014-03-10 MED ORDER — ONDANSETRON HCL 4 MG/2ML IJ SOLN
INTRAMUSCULAR | Status: DC | PRN
  Administered 2014-03-10: 4 mg via INTRAVENOUS

## 2014-03-10 MED ORDER — OXYCODONE HCL 5 MG/5ML PO SOLN: 5.0000 mg | Freq: Once | ORAL | Status: DC | PRN

## 2014-03-10 MED ORDER — ACETAMINOPHEN 325 MG PO TABS: 650.0000 mg | ORAL_TABLET | ORAL | Status: DC | PRN

## 2014-03-10 MED ORDER — LIDOCAINE-EPINEPHRINE (PF) 1 %-1:200000 IJ SOLN
INTRAMUSCULAR | Status: DC | PRN
  Administered 2014-03-10: 40 mL

## 2014-03-10 MED ORDER — TAMSULOSIN HCL 0.4 MG PO CAPS
0.4000 mg | ORAL_CAPSULE | Freq: Every day | ORAL | Status: DC
  Administered 2014-03-10 – 2014-03-12 (×3): 0.4 mg via ORAL
  Filled 2014-03-10 (×3): qty 1

## 2014-03-10 MED ORDER — STERILE WATER FOR INJECTION IJ SOLN
INTRAMUSCULAR | Status: AC
  Filled 2014-03-10: qty 10

## 2014-03-10 MED ORDER — DOCUSATE SODIUM 100 MG PO CAPS
100.0000 mg | ORAL_CAPSULE | Freq: Two times a day (BID) | ORAL | Status: DC
Start: 2014-03-10 — End: 2014-03-12
  Administered 2014-03-10 – 2014-03-12 (×5): 100 mg via ORAL
  Filled 2014-03-10 (×5): qty 1

## 2014-03-10 MED ORDER — DEXTROSE 5 % IV SOLN
2.0000 g | INTRAVENOUS | Status: DC
  Administered 2014-03-10 – 2014-03-11 (×2): 2 g via INTRAVENOUS
  Filled 2014-03-10 (×3): qty 2

## 2014-03-10 MED ORDER — PHENYLEPHRINE HCL 10 MG/ML IJ SOLN
10.0000 mg | INTRAVENOUS | Status: DC | PRN
  Administered 2014-03-10: 25 ug/min via INTRAVENOUS
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
## 2014-03-10 NOTE — Anesthesia Postprocedure Evaluation (Signed)
Anesthesia Post Note  Patient: Lonnie Jackson  Procedure(s) Performed: Procedure(s) (LRB): REPAIR MULTIPLE LACERATIONS EAR (Left) ENDOSCOPIC SINUS SURGERY WITH FUSION NAVIGATION (Left) MINOR GRAFT APPLICATION TRANSPOSED FROM ABDOMEN  (N/A)  Anesthesia type: general  Patient location: PACU  Post pain: Pain level controlled  Post assessment: Patient's Cardiovascular Status Stable  Last Vitals:  Filed Vitals:   03/10/14 0400  BP: 149/86  Pulse: 60  Temp:   Resp: 16    Post vital signs: Reviewed and stable  Level of consciousness: sedated  Complications: No apparent anesthesia complications

## 2014-03-10 NOTE — Transfer of Care (Signed)
Immediate Anesthesia Transfer of Care Note  Patient: Lonnie Jackson  Procedure(s) Performed: Procedure(s): REPAIR MULTIPLE LACERATIONS EAR (Left) ENDOSCOPIC SINUS SURGERY WITH FUSION NAVIGATION (Left) MINOR GRAFT APPLICATION TRANSPOSED FROM ABDOMEN  (N/A)  Patient Location: PACU  Anesthesia Type:General  Level of Consciousness: responds to stimulation  Airway & Oxygen Therapy: Patient Spontanous Breathing and Patient connected to face mask oxygen  Post-op Assessment: Report given to PACU RN, Post -op Vital signs reviewed and stable and Patient moving all extremities  Post vital signs: Reviewed and stable  Complications: No apparent anesthesia complications

## 2014-03-10 NOTE — Progress Notes (Signed)
Subjective: POD#1 from endoscopic repair left sphenoid skull base encephalocele with abdominal fat graft and complex repair left ear/nasal lacerations. In ICU, stable  Objective: Vital signs in last 24 hours: Temp:  [97.6 F (36.4 C)-97.8 F (36.6 C)] 97.7 F (36.5 C) (05/06 0300) Pulse Rate:  [41-68] 60 (05/06 0400) Resp:  [9-20] 16 (05/06 0400) BP: (140-171)/(74-100) 149/86 mmHg (05/06 0400) SpO2:  [92 %-100 %] 98 % (05/06 0400) Weight:  [99.791 kg (220 lb)] 99.791 kg (220 lb) (05/05 1909)  Left nasopore absorbable packing in place with expected mild sanguinous drainage, left ear and nasal sutures intact, left facial abrasions stable  @LABLAST2 (wbc:2,hgb:2,hct:2,plt:2)  Recent Labs  03/09/14 1910  NA 144  K 3.5*  CL 107  CO2 23  GLUCOSE 107*  BUN 20  CREATININE 1.02  CALCIUM 8.5    Medications:  Scheduled Meds: . atenolol  25 mg Oral q morning - 10a  . cefTRIAXone (ROCEPHIN)  IV  2 g Intravenous Q24H  . docusate sodium  100 mg Oral BID  . HYDROmorphone      . mupirocin ointment   Topical TID  . promethazine      . simvastatin  20 mg Oral QHS   Continuous Infusions: . dextrose 5 % and 0.9 % NaCl with KCl 20 mEq/L 100 mL/hr at 03/10/14 0421   PRN Meds:.acetaminophen, morphine injection, ondansetron (ZOFRAN) IV, ondansetron, promethazine  Assessment/Plan: Stable post-op. Can follow up with ENT in ~ 2 weeks for wound check and endoscopy/nasal debridement. Needs PO antibiotic for 2-3 weeks and antibiotic ointment to lacerations. No nose blowing or heavy lifting/straining and should hold his aspirin for 2 weeks. Can change nasal drip pad as needed.   LOS: 1 day   Ruby Cola 03/10/2014, 5:10 AM
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
## 2014-03-10 NOTE — Progress Notes (Signed)
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 Doing well in PACU, sleepy but stable and awakens easily.  Objective: Vital signs in last 24 hours: Temp:  [97.6 F (36.4 C)-97.8 F (36.6 C)] 97.6 F (36.4 C) (05/06 0213) Pulse Rate:  [41-68] 68 (05/06 0230) Resp:  [9-20] 12 (05/06 0230) BP: (142-171)/(74-100) 145/78 mmHg (05/06 0230) SpO2:  [92 %-100 %] 97 % (05/06 0230) Weight:  [99.791 kg (220 lb)] 99.791 kg (220 lb) (05/05 1909)  Stable bilateral periorbital ecchymosis, lacerations clean, dry and intact, left nasal packing in place, no epistaxis, PERRLA, EOMI, vision grossly intact OU.  @LABLAST2 (wbc:2,hgb:2,hct:2,plt:2)  Recent Labs  03/09/14 1910  NA 144  K 3.5*  CL 107  CO2 23  GLUCOSE 107*  BUN 20  CREATININE 1.02  CALCIUM 8.5    Medications:  No current facility-administered medications on file prior to encounter.   No current outpatient prescriptions on file prior to encounter.   Scheduled Meds: . cefTRIAXone (ROCEPHIN)  IV  2 g Intravenous Q24H  . HYDROmorphone      . mupirocin ointment   Topical TID   Continuous Infusions:  PRN Meds:.HYDROmorphone (DILAUDID) injection, oxyCODONE, oxyCODONE, [MAR HOLD] promethazine  Assessment/Plan: Doing well s/p left sphenoid skull base fracture/encephalocele and laceration repairs. No nose blowing or heavy lifting x 4 weeks, leave left nasal packing and laceration sutures in place, these are all absorbable. Mupirocin TID for lacerations. Can follow up with me in 2 weeks for nasal endoscopy and packing removal, will need to be on an oral antibiotic for 2-3 weeks while the packing absorbs.   LOS: 1 day   Ruby Cola 03/10/2014, 2:36 AM

## 2014-03-11 ENCOUNTER — Inpatient Hospital Stay (HOSPITAL_COMMUNITY): Payer: 59

## 2014-03-11 DIAGNOSIS — R339 Retention of urine, unspecified: Secondary | ICD-10-CM | POA: Diagnosis not present

## 2014-03-11 DIAGNOSIS — S0292XA Unspecified fracture of facial bones, initial encounter for closed fracture: Secondary | ICD-10-CM | POA: Diagnosis present

## 2014-03-11 MED ORDER — AMOXICILLIN-POT CLAVULANATE 875-125 MG PO TABS
1.0000 | ORAL_TABLET | Freq: Two times a day (BID) | ORAL | Status: DC
  Administered 2014-03-11 – 2014-03-12 (×3): 1 via ORAL
  Filled 2014-03-11 (×4): qty 1
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
## 2014-03-11 NOTE — Progress Notes (Signed)
OT Cancellation Note  Patient Details Name: Lonnie Jackson MRN: [id number] DOB: [birth year]   Cancelled Treatment:    Reason Eval/Treat Not Completed: Medical issues which prohibited therapy ? CSF leak. Will attempt in am if pt is able to tolerate.  [place], [place]  [phone] 03/11/2014 03/11/2014, 3:40 PM

## 2014-03-11 NOTE — Progress Notes (Signed)
Pt was noted to have a consistent drainage from his left nare.  Additionally, he felt nauseated and vomited.  ENT MD was notified and no further orders were given.  RN went ahead and called [facility] for a second evaluation.  RN suggested to notify Trauma MD.  Trauma MD was notified, and he ordered for a gauze to be taped under his nose for the drainage.  Vital signs were checked and were stable; no neurological changes noted at this time.  Will continue to monitor patient.
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
## 2014-03-12 ENCOUNTER — Encounter (HOSPITAL_COMMUNITY): Payer: Self-pay | Admitting: Otolaryngology

## 2014-03-12 DIAGNOSIS — G9600 Cerebrospinal fluid leak, unspecified: Secondary | ICD-10-CM | POA: Diagnosis present

## 2014-03-12 DIAGNOSIS — IMO0002 Reserved for concepts with insufficient information to code with codable children: Secondary | ICD-10-CM

## 2014-03-12 DIAGNOSIS — S01312A Laceration without foreign body of left ear, initial encounter: Secondary | ICD-10-CM | POA: Diagnosis present

## 2014-03-12 DIAGNOSIS — S0121XA Laceration without foreign body of nose, initial encounter: Secondary | ICD-10-CM | POA: Diagnosis present

## 2014-03-12 DIAGNOSIS — T07XXXA Unspecified multiple injuries, initial encounter: Secondary | ICD-10-CM | POA: Diagnosis present

## 2014-03-12 MED ORDER — HYDROCODONE-ACETAMINOPHEN 5-325 MG PO TABS
1.0000 | ORAL_TABLET | ORAL | Status: DC | PRN
  Administered 2014-03-12: 2 via ORAL
  Filled 2014-03-12: qty 2

## 2014-03-12 MED ORDER — AMOXICILLIN-POT CLAVULANATE 875-125 MG PO TABS: 1.0000 | ORAL_TABLET | Freq: Two times a day (BID) | ORAL | Status: DC

## 2014-03-12 MED ORDER — HYDROCODONE-ACETAMINOPHEN 5-325 MG PO TABS: 1.0000 | ORAL_TABLET | Freq: Four times a day (QID) | ORAL | Status: DC | PRN

## 2014-03-12 MED ORDER — BACITRACIN ZINC 500 UNIT/GM EX OINT
TOPICAL_OINTMENT | Freq: Two times a day (BID) | CUTANEOUS | Status: DC
Start: 2014-03-12 — End: 2017-01-20

## 2014-03-12 MED ORDER — DSS 100 MG PO CAPS: 100.0000 mg | ORAL_CAPSULE | Freq: Two times a day (BID) | ORAL | Status: DC | PRN
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
## 2014-03-12 NOTE — Discharge Instructions (Signed)
Hold aspirin for 2 weeks Pink/yellow/clear nasal drainage is routine and expected after sinus surgery, and they can place a drip pad as needed  No nose blowing, no heavy lifting, and we will recheck the nasal cavity in the office in ~ 2weeks.   Okay to shower from the neck down, may wash wounds gently with soap/water and apply bacitracin to abrasions/scrapes/wounds.

## 2014-03-12 NOTE — Discharge Summary (Signed)
Foley to be discontinued.  This patient has been seen and I agree with the findings and treatment plan.  Kathryne Eriksson. Dahlia Bailiff, MD, [place] [phone] (pager) [phone] (direct pager) Trauma Surgeon
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
## 2014-03-12 NOTE — Progress Notes (Signed)
Pt d/c to home by car with family. Assessment stable. Prescriptions given. Pt verbalizes understanding of d/c instructions.
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
## 2014-10-08 IMAGING — CR DG SHOULDER 2+V*L*
2 series · 2 of 2 positions shown · non-contrast
Comparison: None.

CLINICAL DATA: Trauma, left shoulder abrasion

EXAM:
LEFT SHOULDER - 2+ VIEW

[t shoulder ap internal left]
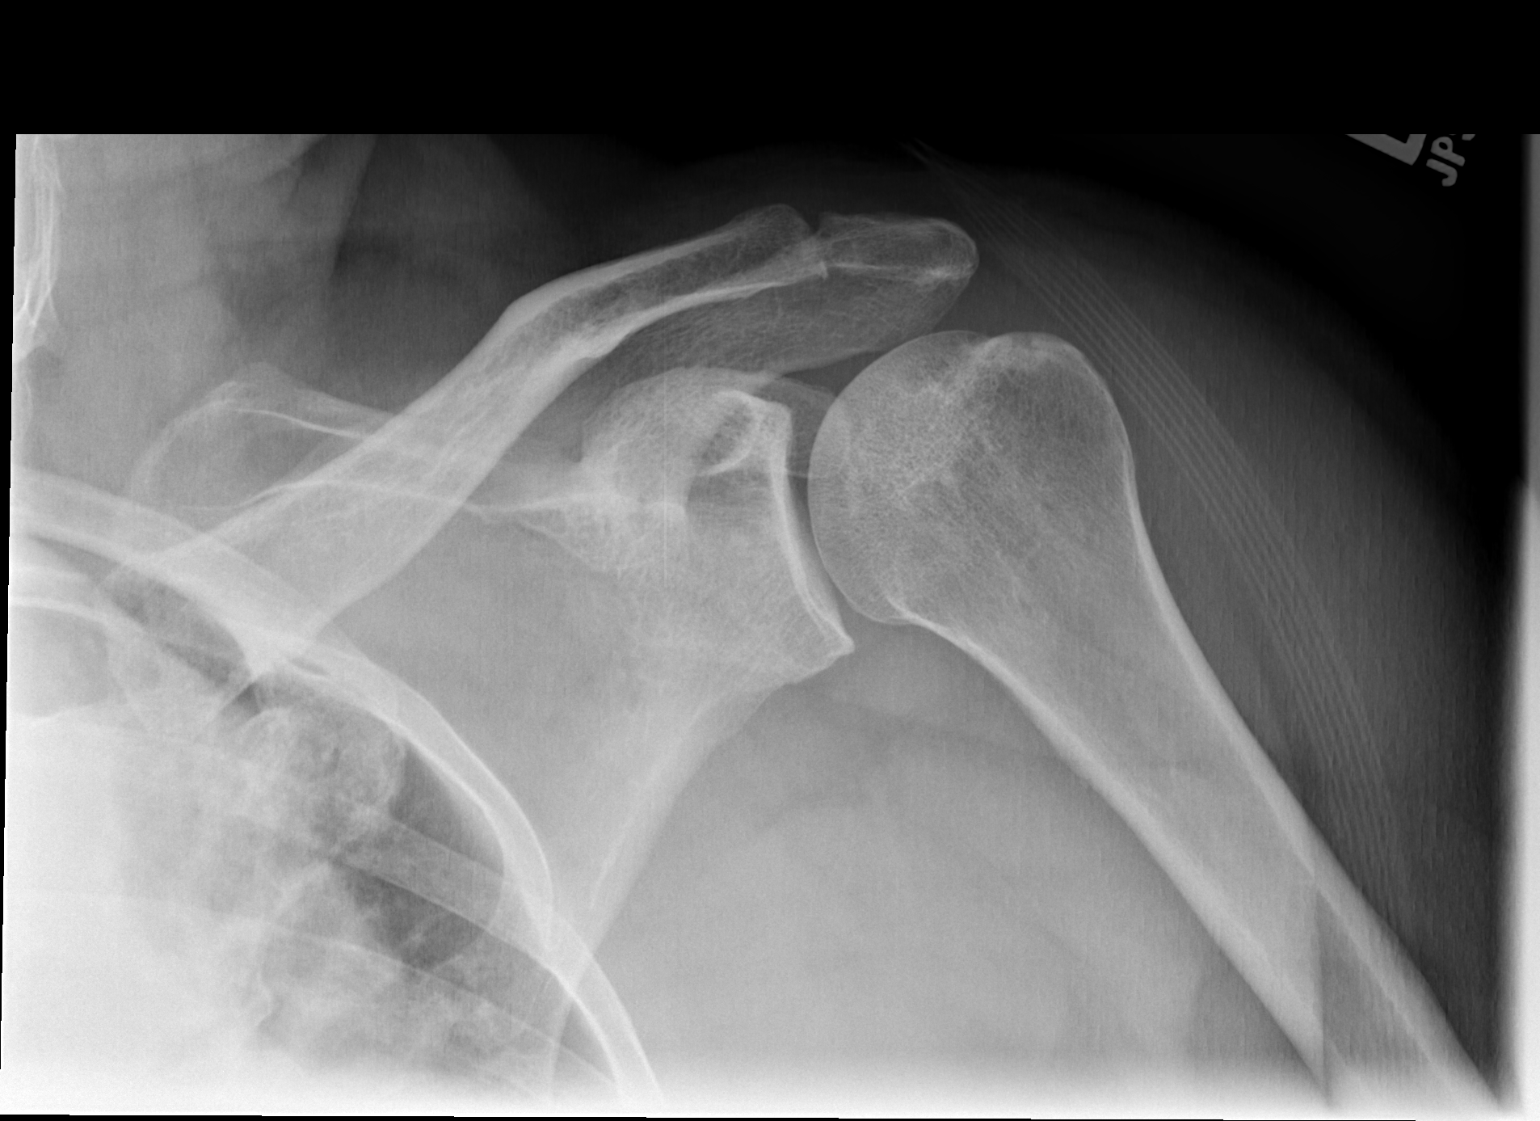

[t shoulder y view left]
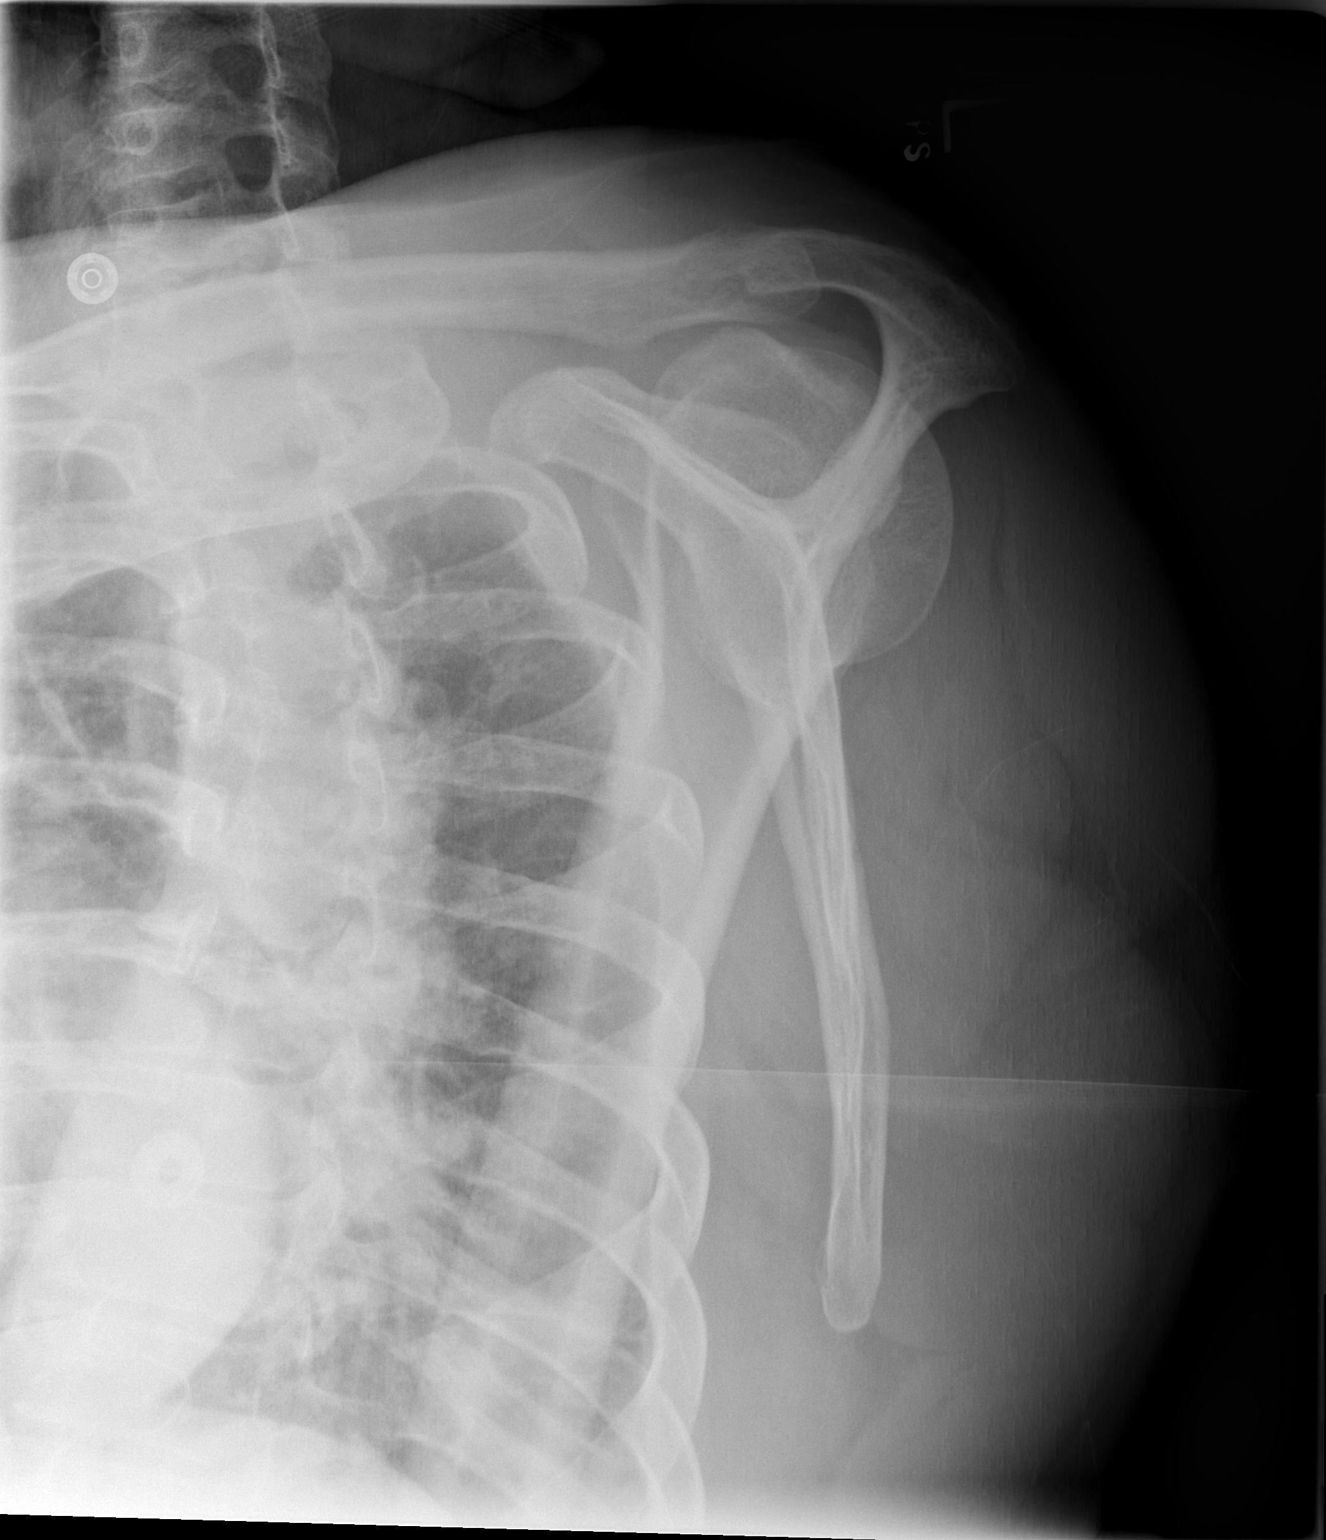

[2 of 2 positions shown; findings below may reference images not displayed]

FINDINGS: Two views of left shoulder submitted. No acute fracture or
subluxation. Glenohumeral joint is preserved. Mild degenerative
changes AC joint.
IMPRESSION: No acute fracture or subluxation. Mild degenerative changes
acromioclavicular joint.

## 2016-08-30 ENCOUNTER — Other Ambulatory Visit: Payer: Self-pay | Admitting: Internal Medicine

## 2016-08-30 ENCOUNTER — Ambulatory Visit
Admission: RE | Admit: 2016-08-30 | Discharge: 2016-08-30 | Disposition: A | Discharge status: Home / Self Care | Payer: Managed Care, Other (non HMO) | Source: Ambulatory Visit | Attending: Internal Medicine | Admitting: Internal Medicine

## 2016-08-30 DIAGNOSIS — R109 Unspecified abdominal pain: Secondary | ICD-10-CM

## 2016-08-30 MED ORDER — IOPAMIDOL (ISOVUE-300) INJECTION 61%
125.0000 mL | Freq: Once | INTRAVENOUS | Status: AC | PRN
  Administered 2016-08-30: 125 mL via INTRAVENOUS

## 2016-10-19 ENCOUNTER — Encounter: Payer: Self-pay | Admitting: Internal Medicine

## 2016-11-27 DIAGNOSIS — H9313 Tinnitus, bilateral: Secondary | ICD-10-CM | POA: Insufficient documentation

## 2016-11-27 DIAGNOSIS — H903 Sensorineural hearing loss, bilateral: Secondary | ICD-10-CM | POA: Insufficient documentation

## 2017-01-20 ENCOUNTER — Encounter (HOSPITAL_COMMUNITY): Payer: Self-pay

## 2017-01-20 ENCOUNTER — Emergency Department (HOSPITAL_COMMUNITY)
Admission: EM | Admit: 2017-01-20 | Discharge: 2017-01-20 | Disposition: A | Discharge status: Home / Self Care | Payer: 59 | Attending: Emergency Medicine | Admitting: Emergency Medicine

## 2017-01-20 DIAGNOSIS — I10 Essential (primary) hypertension: Secondary | ICD-10-CM | POA: Diagnosis not present

## 2017-01-20 DIAGNOSIS — R1033 Periumbilical pain: Secondary | ICD-10-CM | POA: Diagnosis not present

## 2017-01-20 DIAGNOSIS — R112 Nausea with vomiting, unspecified: Secondary | ICD-10-CM | POA: Diagnosis not present

## 2017-01-20 DIAGNOSIS — R197 Diarrhea, unspecified: Secondary | ICD-10-CM | POA: Insufficient documentation

## 2017-01-20 LAB — COMPREHENSIVE METABOLIC PANEL
ALBUMIN: 4.7 g/dL (ref 3.5–5.0)
ALT: 42 U/L (ref 17–63)
AST: 32 U/L (ref 15–41)
Alkaline Phosphatase: 58 U/L (ref 38–126)
Anion gap: 9 (ref 5–15)
BUN: 15 mg/dL (ref 6–20)
CHLORIDE: 104 mmol/L (ref 101–111)
CO2: 28 mmol/L (ref 22–32)
CREATININE: 1.19 mg/dL (ref 0.61–1.24)
Calcium: 9.7 mg/dL (ref 8.9–10.3)
GFR calc Af Amer: >60 mL/min (ref >60)
GFR calc non Af Amer: >60 mL/min (ref >60)
GLUCOSE: 115 mg/dL — AB (ref 65–99)
Potassium: 4 mmol/L (ref 3.5–5.1)
SODIUM: 141 mmol/L (ref 135–145)
Total Bilirubin: 0.9 mg/dL (ref 0.3–1.2)
Total Protein: 8.2 g/dL — ABNORMAL HIGH (ref 6.5–8.1)

## 2017-01-20 LAB — CBC
HCT: 45.7 % (ref 39.0–52.0)
Hemoglobin: 16.3 g/dL (ref 13.0–17.0)
MCH: 28.8 pg (ref 26.0–34.0)
MCHC: 35.7 g/dL (ref 30.0–36.0)
MCV: 80.7 fL (ref 78.0–100.0)
PLATELETS: 119 10*3/uL — AB (ref 150–400)
RBC: 5.66 MIL/uL (ref 4.22–5.81)
RDW: 13.1 % (ref 11.5–15.5)
WBC: 11.7 10*3/uL — ABNORMAL HIGH (ref 4.0–10.5)

## 2017-01-20 LAB — LIPASE, BLOOD: Lipase: 27 U/L (ref 11–51)

## 2017-01-20 MED ORDER — ONDANSETRON HCL 4 MG/2ML IJ SOLN
4.0000 mg | Freq: Once | INTRAMUSCULAR | Status: AC
  Administered 2017-01-20: 4 mg via INTRAVENOUS
  Filled 2017-01-20: qty 2

## 2017-01-20 MED ORDER — SODIUM CHLORIDE 0.9 % IV BOLUS (SEPSIS)
1000.0000 mL | Freq: Once | INTRAVENOUS | Status: AC
  Administered 2017-01-20: 1000 mL via INTRAVENOUS

## 2017-01-20 MED ORDER — ONDANSETRON 4 MG PO TBDP: 4.0000 mg | ORAL_TABLET | Freq: Three times a day (TID) | ORAL | 0 refills | Status: AC | PRN

## 2017-01-20 MED ORDER — GI COCKTAIL ~~LOC~~
30.0000 mL | Freq: Once | ORAL | Status: AC
  Administered 2017-01-20: 30 mL via ORAL
  Filled 2017-01-20: qty 30

## 2017-01-20 MED ORDER — FENTANYL CITRATE (PF) 100 MCG/2ML IJ SOLN
50.0000 ug | Freq: Once | INTRAMUSCULAR | Status: AC
  Administered 2017-01-20: 50 ug via INTRAVENOUS
  Filled 2017-01-20: qty 2

## 2017-01-20 NOTE — ED Notes (Signed)
Asked pt if he could give a urine sample, but said he's not able to. 
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
## 2017-01-20 NOTE — ED Triage Notes (Signed)
Pt started experiencing lower abdominal pain, N/V/D about 4 hours ago. A&Ox4. Ambulatory. No urinary issues

## 2017-03-29 IMAGING — CT CT ABD-PELV W/ CM
3 of 5 series · 12 of 36 positions shown, 18 images · IV contrast (WATER & [ID] ISOVUE 300)
Comparison: 11/19/2003

CLINICAL DATA: Upper abdomen pain and cramping, elevated LFT's ( 4
weeks), loss of energy, fatigue, headaches, hx of hep c, no hx of
cancer

EXAM:
CT ABDOMEN AND PELVIS WITH CONTRAST
TECHNIQUE: Multidetector CT imaging of the abdomen and pelvis was performed
using the standard protocol following bolus administration of
intravenous contrast.
CONTRAST:  125mL 9SSDH6-FQQ IOPAMIDOL (9SSDH6-FQQ) INJECTION 61%

[Series 3: abd/pelvis with · axial · 0.81mm/px · z∈[-379,+6]mm · 8 of 100 slices shown, 13 images]
[im 12/100  soft-tissue]
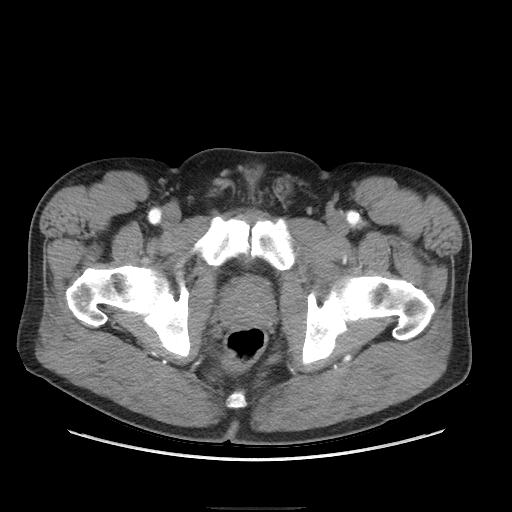
[im 12/100  bone]
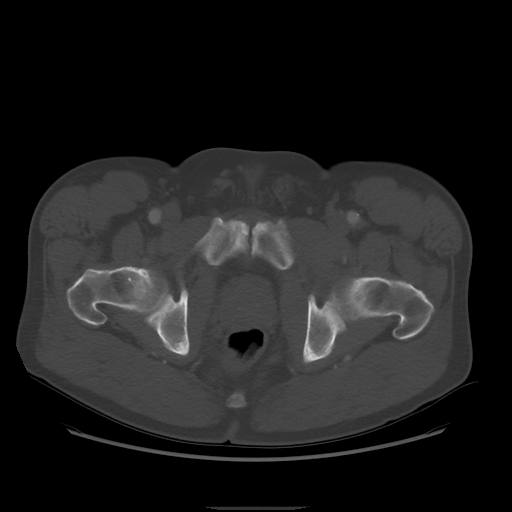
[im 23/100  soft-tissue]
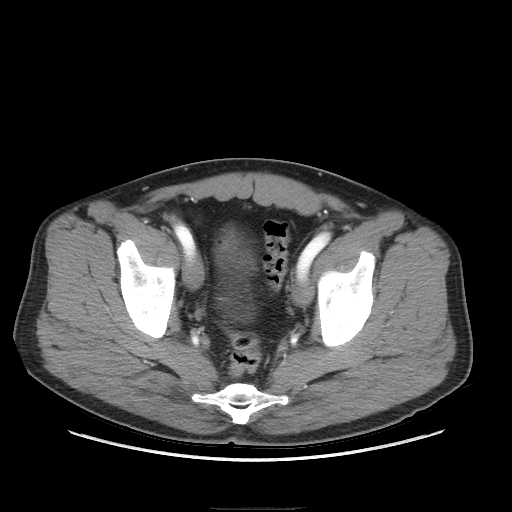
[im 34/100  soft-tissue]
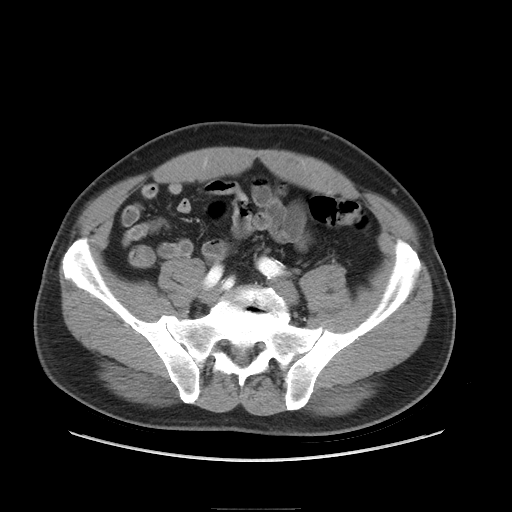
[im 45/100  soft-tissue]
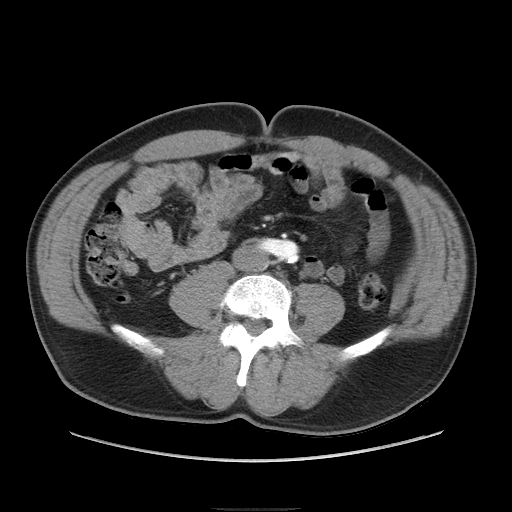
[im 56/100  soft-tissue]
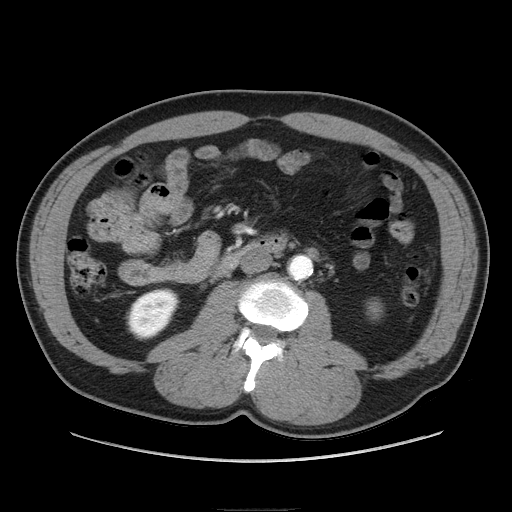
[im 56/100  lung]
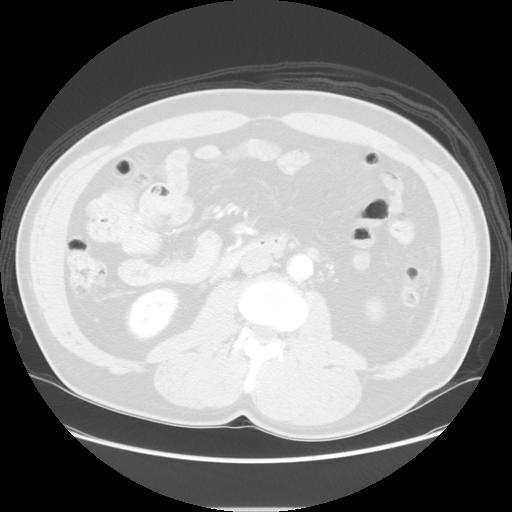
[im 67/100  soft-tissue]
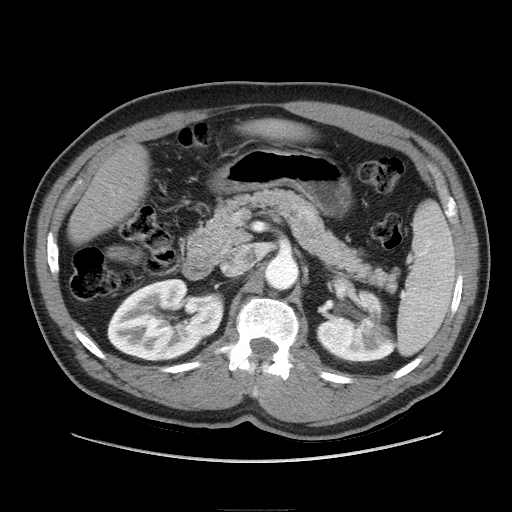
[im 67/100  lung]
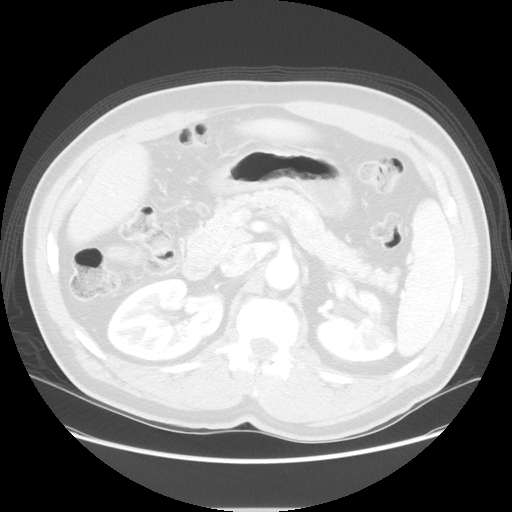
[im 78/100  soft-tissue]
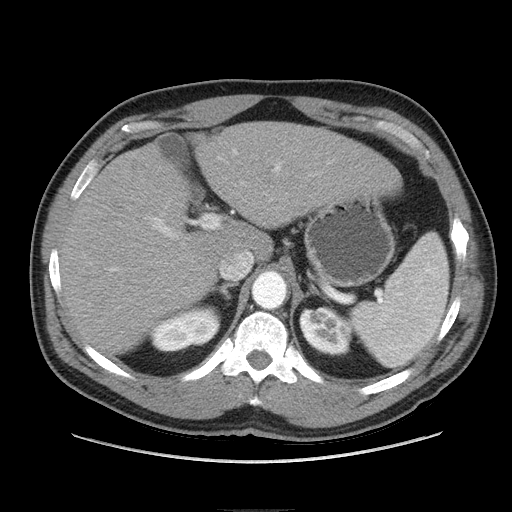
[im 78/100  lung]
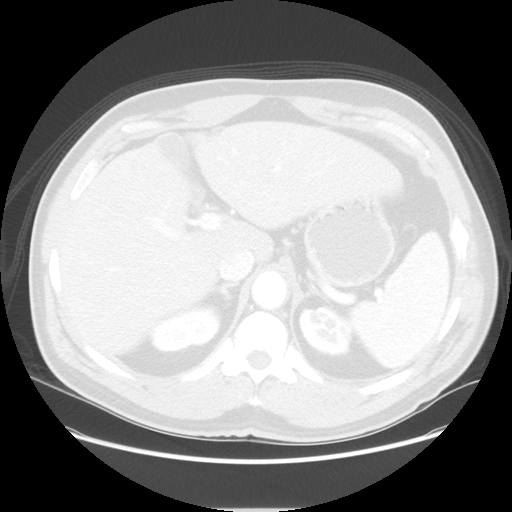
[im 89/100  soft-tissue]
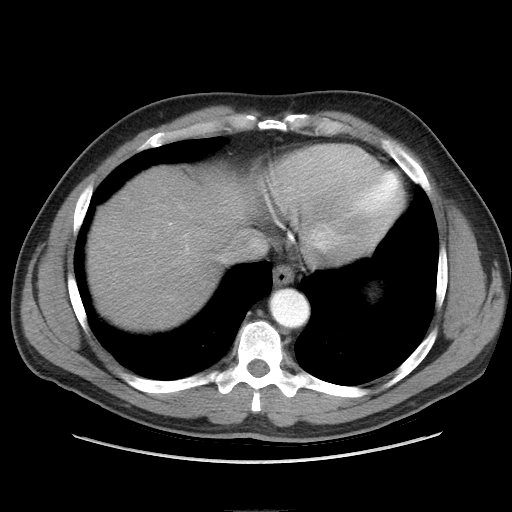
[im 89/100  lung]
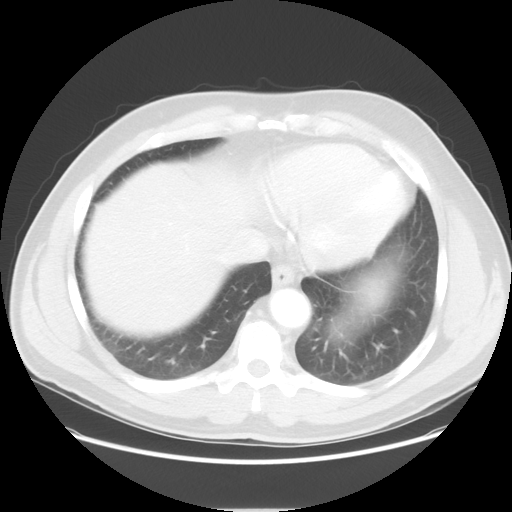

[Series 601: coronal body · coronal · 1.03mm/px · 1 of 122 slices shown, 2 images]
[im 41/122  soft-tissue]
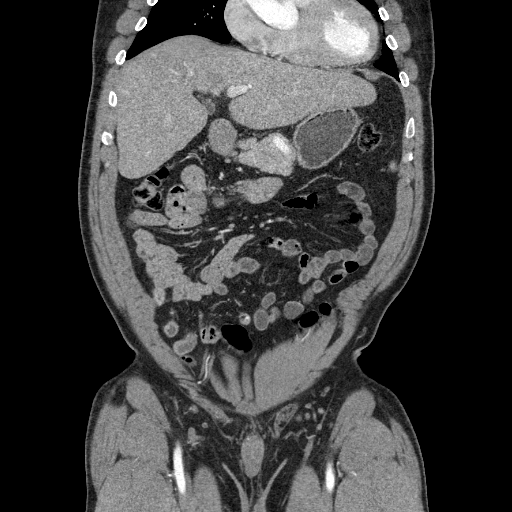
[im 41/122  bone]
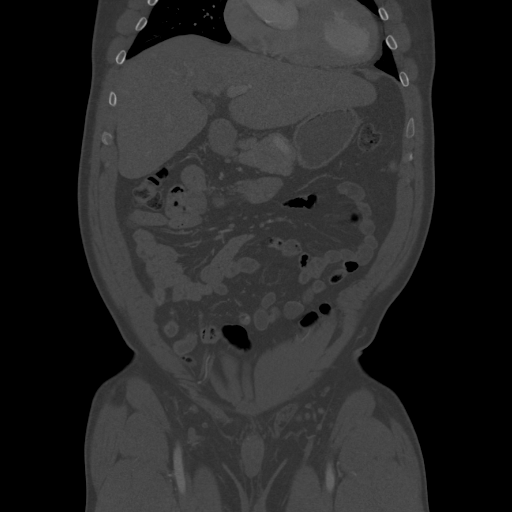

[Series 602: sagittal body · sagittal · 1.03mm/px · 3 of 168 slices shown]
[im 12/168  soft-tissue]
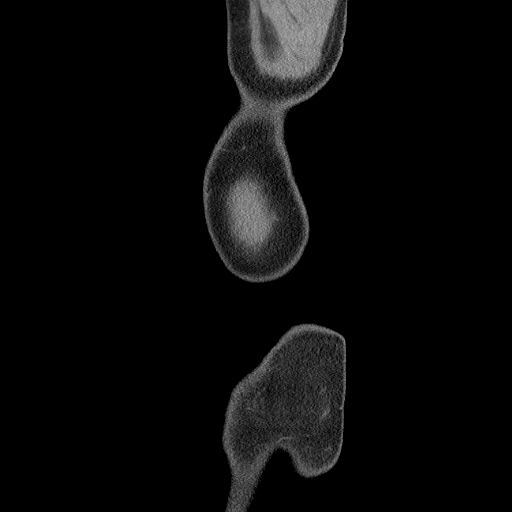
[im 34/168  soft-tissue]
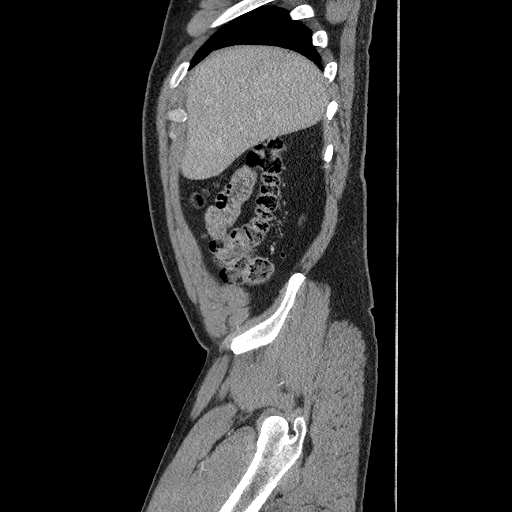
[im 56/168  soft-tissue]
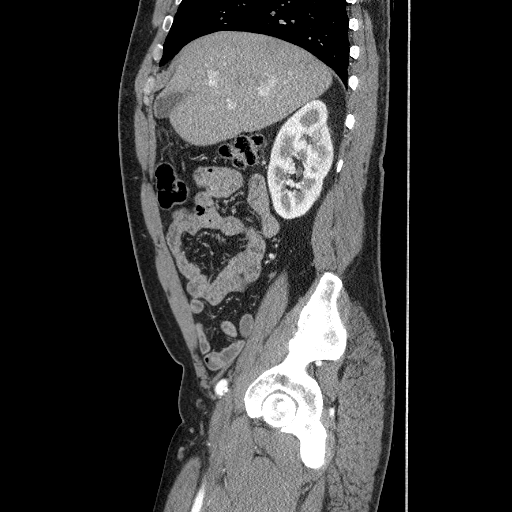

[12 of 36 positions shown; findings below may reference images not displayed]

FINDINGS: Lower chest: Clear lung bases.  Heart normal size.

Hepatobiliary: Liver shows relative central volume loss and
enlargement of the lateral segment left lobe similar to the prior
CT. This suggests cirrhosis. No liver mass or focal lesion. Normal
gallbladder. No bile duct dilation.

Pancreas: Unremarkable. No pancreatic ductal dilatation or
surrounding inflammatory changes.

Spleen: Normal in size without focal abnormality.

Adrenals/Urinary Tract: No adrenal masses. There is scarring along
the mid to upper pole the left kidney. Small, 9 mm, low-density
lesion protrudes from the posterior medial aspect of the lower pole
the right kidney consistent with a cyst. No other renal masses. No
stones. No hydronephrosis. Normal ureters. Normal bladder.

Stomach/Bowel: Stomach is within normal limits. Appendix appears
normal. No evidence of bowel wall thickening, distention, or
inflammatory changes.

Vascular/Lymphatic: Aortic atherosclerosis. No enlarged abdominal or
pelvic lymph nodes.

Reproductive: Prostate is unremarkable.

Other: No abdominal wall hernia or abnormality. No abdominopelvic
ascites.

Musculoskeletal: No osteoblastic or osteolytic lesions. Degenerative
changes of the lumbar spine.
IMPRESSION: 1. No acute findings within the abdomen or pelvis.
2. Morphologic changes of the liver raises the possibility of
cirrhosis. No liver mass or focal lesion.
3. Left renal scarring, developing from the areas of renal
infarction or pyelonephritis seen on the prior CT scan.
4. Aortic atherosclerosis.

## 2017-04-16 ENCOUNTER — Other Ambulatory Visit: Payer: Self-pay | Admitting: Internal Medicine

## 2017-04-16 DIAGNOSIS — R319 Hematuria, unspecified: Secondary | ICD-10-CM

## 2017-04-17 ENCOUNTER — Ambulatory Visit
Admission: RE | Admit: 2017-04-17 | Discharge: 2017-04-17 | Disposition: A | Discharge status: Home / Self Care | Payer: Managed Care, Other (non HMO) | Source: Ambulatory Visit | Attending: Internal Medicine | Admitting: Internal Medicine

## 2017-04-17 DIAGNOSIS — R319 Hematuria, unspecified: Secondary | ICD-10-CM

## 2017-07-18 ENCOUNTER — Other Ambulatory Visit: Payer: Self-pay | Admitting: Internal Medicine

## 2017-07-18 DIAGNOSIS — I10 Essential (primary) hypertension: Secondary | ICD-10-CM

## 2017-07-18 DIAGNOSIS — R5383 Other fatigue: Secondary | ICD-10-CM

## 2017-07-24 ENCOUNTER — Ambulatory Visit (HOSPITAL_COMMUNITY): Payer: Managed Care, Other (non HMO) | Attending: Cardiovascular Disease

## 2017-07-24 ENCOUNTER — Other Ambulatory Visit: Payer: Self-pay

## 2017-07-24 DIAGNOSIS — E7800 Pure hypercholesterolemia, unspecified: Secondary | ICD-10-CM | POA: Diagnosis not present

## 2017-07-24 DIAGNOSIS — R5383 Other fatigue: Secondary | ICD-10-CM

## 2017-07-24 DIAGNOSIS — I10 Essential (primary) hypertension: Secondary | ICD-10-CM | POA: Insufficient documentation

## 2017-07-24 HISTORY — PX: TRANSTHORACIC ECHOCARDIOGRAM: SHX275

## 2017-09-23 ENCOUNTER — Ambulatory Visit: Payer: Self-pay | Admitting: Surgery

## 2017-09-24 ENCOUNTER — Ambulatory Visit: Payer: Self-pay | Admitting: Surgery

## 2017-09-25 ENCOUNTER — Encounter (HOSPITAL_BASED_OUTPATIENT_CLINIC_OR_DEPARTMENT_OTHER): Payer: Self-pay | Admitting: *Deleted

## 2017-09-25 ENCOUNTER — Other Ambulatory Visit: Payer: Self-pay

## 2017-09-25 NOTE — Progress Notes (Addendum)
NPO AFTER MN W/ EXCEPTION CLEAR LIQUIDS UNTIL 0930 (NO CREAM / MILK PRODUCTS).  ARRIVE AT 1330.  NEEDS ISTAT AND EKG.  WILL TAKE AM MEDS DOS W/ SIPS OF WATER.  PT VERBALIZED UNDERSTANDING RECTAL PREP INSTRUCTIONS.    ADDENDUM:  PT CASE MOVED TO Thursday, 10-03-2017 AND TIME CHANGED.  CALLED AND SPOKE W/ PT VIA PHONE TODAY , Monday 09-30-2017.  VERBALIZED UNDERSTANDING ARRIVE AT 3428 AND NPO AFTER MN W/ EXCEPTION LIQUIDS UNTIL 0845.

## 2017-10-03 ENCOUNTER — Ambulatory Visit (HOSPITAL_BASED_OUTPATIENT_CLINIC_OR_DEPARTMENT_OTHER): Payer: 59 | Admitting: Anesthesiology

## 2017-10-03 ENCOUNTER — Encounter (HOSPITAL_BASED_OUTPATIENT_CLINIC_OR_DEPARTMENT_OTHER)
Admission: RE | Disposition: A | Discharge status: Home / Self Care | Payer: Self-pay | Source: Ambulatory Visit | Attending: Surgery

## 2017-10-03 ENCOUNTER — Other Ambulatory Visit: Payer: Self-pay

## 2017-10-03 ENCOUNTER — Encounter (HOSPITAL_BASED_OUTPATIENT_CLINIC_OR_DEPARTMENT_OTHER): Payer: Self-pay | Admitting: Anesthesiology

## 2017-10-03 ENCOUNTER — Ambulatory Visit (HOSPITAL_BASED_OUTPATIENT_CLINIC_OR_DEPARTMENT_OTHER)
Admission: RE | Admit: 2017-10-03 | Discharge: 2017-10-03 | Disposition: A | Discharge status: Home / Self Care | Payer: 59 | Source: Ambulatory Visit | Attending: Surgery | Admitting: Surgery

## 2017-10-03 DIAGNOSIS — K624 Stenosis of anus and rectum: Secondary | ICD-10-CM | POA: Insufficient documentation

## 2017-10-03 DIAGNOSIS — Z6831 Body mass index (BMI) 31.0-31.9, adult: Secondary | ICD-10-CM | POA: Diagnosis not present

## 2017-10-03 DIAGNOSIS — Z79899 Other long term (current) drug therapy: Secondary | ICD-10-CM | POA: Diagnosis not present

## 2017-10-03 DIAGNOSIS — E7800 Pure hypercholesterolemia, unspecified: Secondary | ICD-10-CM | POA: Insufficient documentation

## 2017-10-03 DIAGNOSIS — I10 Essential (primary) hypertension: Secondary | ICD-10-CM | POA: Diagnosis not present

## 2017-10-03 DIAGNOSIS — L918 Other hypertrophic disorders of the skin: Secondary | ICD-10-CM | POA: Insufficient documentation

## 2017-10-03 DIAGNOSIS — Z87891 Personal history of nicotine dependence: Secondary | ICD-10-CM | POA: Insufficient documentation

## 2017-10-03 DIAGNOSIS — K642 Third degree hemorrhoids: Secondary | ICD-10-CM | POA: Diagnosis not present

## 2017-10-03 DIAGNOSIS — E669 Obesity, unspecified: Secondary | ICD-10-CM | POA: Diagnosis not present

## 2017-10-03 DIAGNOSIS — E039 Hypothyroidism, unspecified: Secondary | ICD-10-CM | POA: Insufficient documentation

## 2017-10-03 HISTORY — DX: Presence of external hearing-aid: Z97.4

## 2017-10-03 HISTORY — DX: Personal history of other infectious and parasitic diseases: Z86.19

## 2017-10-03 HISTORY — DX: Unspecified hemorrhoids: K64.9

## 2017-10-03 HISTORY — DX: Personal history of other diseases of the circulatory system: Z86.79

## 2017-10-03 HISTORY — DX: Presence of spectacles and contact lenses: Z97.3

## 2017-10-03 HISTORY — DX: Hypothyroidism, unspecified: E03.9

## 2017-10-03 HISTORY — DX: Personal history of other diseases of urinary system: Z87.448

## 2017-10-03 HISTORY — PX: EVALUATION UNDER ANESTHESIA WITH HEMORRHOIDECTOMY: SHX5624

## 2017-10-03 HISTORY — DX: Personal history of other diseases of the digestive system: Z87.19

## 2017-10-03 LAB — POCT I-STAT 4, (NA,K, GLUC, HGB,HCT)
Glucose, Bld: 90 mg/dL (ref 65–99)
HCT: 44 % (ref 39.0–52.0)
Hemoglobin: 15 g/dL (ref 13.0–17.0)
POTASSIUM: 4.3 mmol/L (ref 3.5–5.1)
Sodium: 139 mmol/L (ref 135–145)

## 2017-10-03 SURGERY — EXAM UNDER ANESTHESIA WITH HEMORRHOIDECTOMY
Anesthesia: General | Site: Rectum

## 2017-10-03 MED ORDER — ACETAMINOPHEN 500 MG PO TABS
1000.0000 mg | ORAL_TABLET | ORAL | Status: AC
Start: 2017-10-04 — End: 2017-10-03
  Administered 2017-10-03: 1000 mg via ORAL
  Filled 2017-10-03: qty 2

## 2017-10-03 MED ORDER — GABAPENTIN 300 MG PO CAPS
300.0000 mg | ORAL_CAPSULE | ORAL | Status: AC
  Administered 2017-10-03: 300 mg via ORAL
  Filled 2017-10-03: qty 1

## 2017-10-03 MED ORDER — FENTANYL CITRATE (PF) 100 MCG/2ML IJ SOLN
INTRAMUSCULAR | Status: AC
  Filled 2017-10-03: qty 2

## 2017-10-03 MED ORDER — CEFAZOLIN SODIUM-DEXTROSE 2-4 GM/100ML-% IV SOLN
INTRAVENOUS | Status: AC
  Filled 2017-10-03: qty 100

## 2017-10-03 MED ORDER — PROPOFOL 10 MG/ML IV BOLUS
INTRAVENOUS | Status: DC | PRN
  Administered 2017-10-03: 50 mg via INTRAVENOUS

## 2017-10-03 MED ORDER — FENTANYL CITRATE (PF) 100 MCG/2ML IJ SOLN
25.0000 ug | INTRAMUSCULAR | Status: DC | PRN
  Filled 2017-10-03: qty 1

## 2017-10-03 MED ORDER — NAPROXEN 500 MG PO TABS: 500.0000 mg | ORAL_TABLET | Freq: Two times a day (BID) | ORAL | 1 refills | Status: DC | PRN

## 2017-10-03 MED ORDER — KETOROLAC TROMETHAMINE 30 MG/ML IJ SOLN
INTRAMUSCULAR | Status: DC | PRN
  Administered 2017-10-03: 30 mg via INTRAVENOUS

## 2017-10-03 MED ORDER — ACETAMINOPHEN 500 MG PO TABS
ORAL_TABLET | ORAL | Status: AC
  Filled 2017-10-03: qty 2

## 2017-10-03 MED ORDER — CHLORHEXIDINE GLUCONATE CLOTH 2 % EX PADS
6.0000 | MEDICATED_PAD | Freq: Once | CUTANEOUS | Status: DC
  Filled 2017-10-03: qty 6

## 2017-10-03 MED ORDER — BUPIVACAINE LIPOSOME 1.3 % IJ SUSP
INTRAMUSCULAR | Status: DC | PRN
  Administered 2017-10-03: 20 mL

## 2017-10-03 MED ORDER — CEFAZOLIN SODIUM-DEXTROSE 2-4 GM/100ML-% IV SOLN
2.0000 g | INTRAVENOUS | Status: AC
  Administered 2017-10-03: 2 g via INTRAVENOUS
  Filled 2017-10-03: qty 100

## 2017-10-03 MED ORDER — LIDOCAINE 2% (20 MG/ML) 5 ML SYRINGE
INTRAMUSCULAR | Status: AC
  Filled 2017-10-03: qty 5

## 2017-10-03 MED ORDER — LACTATED RINGERS IV SOLN
INTRAVENOUS | Status: DC
  Administered 2017-10-03 (×4): via INTRAVENOUS
  Filled 2017-10-03: qty 1000

## 2017-10-03 MED ORDER — BUPIVACAINE-EPINEPHRINE 0.25% -1:200000 IJ SOLN
INTRAMUSCULAR | Status: DC | PRN
  Administered 2017-10-03: 30 mL

## 2017-10-03 MED ORDER — MIDAZOLAM HCL 2 MG/2ML IJ SOLN
INTRAMUSCULAR | Status: AC
Start: 2017-10-03 — End: 2017-10-03
  Filled 2017-10-03: qty 2

## 2017-10-03 MED ORDER — PROPOFOL 500 MG/50ML IV EMUL
INTRAVENOUS | Status: AC
  Filled 2017-10-03: qty 50

## 2017-10-03 MED ORDER — METRONIDAZOLE IN NACL 5-0.79 MG/ML-% IV SOLN
500.0000 mg | INTRAVENOUS | Status: AC
  Administered 2017-10-03: 500 mg via INTRAVENOUS
  Filled 2017-10-03 (×2): qty 100

## 2017-10-03 MED ORDER — OXYCODONE HCL 5 MG PO TABS
5.0000 mg | ORAL_TABLET | Freq: Once | ORAL | Status: AC
  Administered 2017-10-03: 5 mg via ORAL
  Filled 2017-10-03: qty 1

## 2017-10-03 MED ORDER — FENTANYL CITRATE (PF) 100 MCG/2ML IJ SOLN
INTRAMUSCULAR | Status: DC | PRN
  Administered 2017-10-03 (×4): 25 ug via INTRAVENOUS

## 2017-10-03 MED ORDER — BUPIVACAINE LIPOSOME 1.3 % IJ SUSP
20.0000 mL | INTRAMUSCULAR | Status: DC
  Filled 2017-10-03: qty 20

## 2017-10-03 MED ORDER — ONDANSETRON HCL 4 MG/2ML IJ SOLN
INTRAMUSCULAR | Status: DC | PRN
  Administered 2017-10-03: 4 mg via INTRAVENOUS

## 2017-10-03 MED ORDER — MIDAZOLAM HCL 2 MG/2ML IJ SOLN
INTRAMUSCULAR | Status: AC
  Filled 2017-10-03: qty 2

## 2017-10-03 MED ORDER — GABAPENTIN 300 MG PO CAPS
ORAL_CAPSULE | ORAL | Status: AC
  Filled 2017-10-03: qty 1

## 2017-10-03 MED ORDER — MIDAZOLAM HCL 5 MG/5ML IJ SOLN
INTRAMUSCULAR | Status: DC | PRN
  Administered 2017-10-03: 2 mg via INTRAVENOUS
  Administered 2017-10-03 (×2): 1 mg via INTRAVENOUS

## 2017-10-03 MED ORDER — ONDANSETRON HCL 4 MG/2ML IJ SOLN
INTRAMUSCULAR | Status: AC
  Filled 2017-10-03: qty 2

## 2017-10-03 MED ORDER — CELECOXIB 200 MG PO CAPS
200.0000 mg | ORAL_CAPSULE | ORAL | Status: AC
  Administered 2017-10-03: 200 mg via ORAL
  Filled 2017-10-03: qty 1

## 2017-10-03 MED ORDER — AMBULATORY NON FORMULARY MEDICATION: 1.0000 "application " | Freq: Four times a day (QID) | 2 refills | Status: DC

## 2017-10-03 MED ORDER — PROPOFOL 10 MG/ML IV BOLUS
INTRAVENOUS | Status: AC
  Filled 2017-10-03: qty 40

## 2017-10-03 MED ORDER — ONDANSETRON HCL 4 MG/2ML IJ SOLN
4.0000 mg | Freq: Once | INTRAMUSCULAR | Status: DC | PRN
  Filled 2017-10-03: qty 2

## 2017-10-03 MED ORDER — DIBUCAINE 1 % RE OINT
TOPICAL_OINTMENT | RECTAL | Status: DC | PRN
  Administered 2017-10-03: 1 via RECTAL

## 2017-10-03 MED ORDER — DEXAMETHASONE SODIUM PHOSPHATE 10 MG/ML IJ SOLN
INTRAMUSCULAR | Status: AC
  Filled 2017-10-03: qty 1

## 2017-10-03 MED ORDER — CELECOXIB 200 MG PO CAPS
ORAL_CAPSULE | ORAL | Status: AC
  Filled 2017-10-03: qty 1

## 2017-10-03 MED ORDER — LIDOCAINE HCL (CARDIAC) 20 MG/ML IV SOLN
INTRAVENOUS | Status: DC | PRN
  Administered 2017-10-03: 60 mg via INTRAVENOUS

## 2017-10-03 MED ORDER — DEXAMETHASONE SODIUM PHOSPHATE 4 MG/ML IJ SOLN
INTRAMUSCULAR | Status: DC | PRN
  Administered 2017-10-03: 10 mg via INTRAVENOUS

## 2017-10-03 MED ORDER — OXYCODONE HCL 5 MG PO TABS
ORAL_TABLET | ORAL | Status: AC
  Filled 2017-10-03: qty 1

## 2017-10-03 MED ORDER — OXYCODONE HCL 5 MG PO TABS: 5.0000 mg | ORAL_TABLET | ORAL | 0 refills | Status: DC | PRN

## 2017-10-03 MED ORDER — PROPOFOL 500 MG/50ML IV EMUL
INTRAVENOUS | Status: DC | PRN
  Administered 2017-10-03: 50 ug/kg/min via INTRAVENOUS

## 2017-10-03 SURGICAL SUPPLY — 61 items
APL SKNCLS STERI-STRIP NONHPOA (GAUZE/BANDAGES/DRESSINGS) ×1
BENZOIN TINCTURE PRP APPL 2/3 (GAUZE/BANDAGES/DRESSINGS) ×2 IMPLANT
BLADE HEX COATED 2.75 (ELECTRODE) ×2 IMPLANT
BLADE SURG 10 STRL SS (BLADE) IMPLANT
BLADE SURG 15 STRL LF DISP TIS (BLADE) ×1 IMPLANT
BLADE SURG 15 STRL SS (BLADE) ×2
BNDG GAUZE ELAST 4 BULKY (GAUZE/BANDAGES/DRESSINGS) ×1 IMPLANT
BRIEF STRETCH FOR OB PAD LRG (UNDERPADS AND DIAPERS) ×2 IMPLANT
CANISTER SUCTION 1200CC (MISCELLANEOUS) ×1 IMPLANT
CANISTER SUCTION 2500CC (MISCELLANEOUS) ×1 IMPLANT
COVER BACK TABLE 60X90IN (DRAPES) ×2 IMPLANT
COVER MAYO STAND STRL (DRAPES) ×2 IMPLANT
DECANTER SPIKE VIAL GLASS SM (MISCELLANEOUS) ×2 IMPLANT
DRAPE LAPAROTOMY 100X72 PEDS (DRAPES) ×2 IMPLANT
DRAPE LG THREE QUARTER DISP (DRAPES) IMPLANT
DRSG TEGADERM 4X4.75 (GAUZE/BANDAGES/DRESSINGS) ×1 IMPLANT
ELECT NDL TIP 2.8 STRL (NEEDLE) IMPLANT
ELECT NEEDLE TIP 2.8 STRL (NEEDLE) IMPLANT
ELECT REM PT RETURN 9FT ADLT (ELECTROSURGICAL) ×2
ELECTRODE REM PT RTRN 9FT ADLT (ELECTROSURGICAL) ×1 IMPLANT
GLOVE BIOGEL PI IND STRL 7.5 (GLOVE) IMPLANT
GLOVE BIOGEL PI IND STRL 8 (GLOVE) ×1 IMPLANT
GLOVE BIOGEL PI INDICATOR 7.5 (GLOVE) ×3
GLOVE BIOGEL PI INDICATOR 8 (GLOVE) ×1
GLOVE ECLIPSE 8.0 STRL XLNG CF (GLOVE) ×2 IMPLANT
GOWN STRL REUS W/ TWL LRG LVL3 (GOWN DISPOSABLE) ×1 IMPLANT
GOWN STRL REUS W/ TWL XL LVL3 (GOWN DISPOSABLE) ×1 IMPLANT
GOWN STRL REUS W/TWL LRG LVL3 (GOWN DISPOSABLE)
GOWN STRL REUS W/TWL XL LVL3 (GOWN DISPOSABLE) ×2 IMPLANT
IV CATH 22GX1 FEP (IV SOLUTION) ×2 IMPLANT
KIT RM TURNOVER CYSTO AR (KITS) ×2 IMPLANT
LEGGING LITHOTOMY PAIR STRL (DRAPES) IMPLANT
NEEDLE HYPO 22GX1.5 SAFETY (NEEDLE) ×2 IMPLANT
NS IRRIG 500ML POUR BTL (IV SOLUTION) ×2 IMPLANT
PACK BASIN DAY SURGERY FS (CUSTOM PROCEDURE TRAY) ×2 IMPLANT
PAD ABD 8X10 STRL (GAUZE/BANDAGES/DRESSINGS) ×2 IMPLANT
PENCIL BUTTON HOLSTER BLD 10FT (ELECTRODE) ×2 IMPLANT
SCRUB TECHNI CARE 4 OZ NO DYE (MISCELLANEOUS) ×2 IMPLANT
SHEARS HARMONIC 9CM CVD (BLADE) IMPLANT
SPONGE GAUZE 2X2 8PLY STRL LF (GAUZE/BANDAGES/DRESSINGS) ×1 IMPLANT
SPONGE SURGIFOAM ABS GEL 100 (HEMOSTASIS) IMPLANT
SPONGE SURGIFOAM ABS GEL 12-7 (HEMOSTASIS) IMPLANT
SURGILUBE 2OZ TUBE FLIPTOP (MISCELLANEOUS) ×2 IMPLANT
SUT CHROMIC 2 0 SH (SUTURE) ×2 IMPLANT
SUT CHROMIC 3 0 SH 27 (SUTURE) IMPLANT
SUT MNCRL AB 4-0 PS2 18 (SUTURE) ×1 IMPLANT
SUT VIC AB 2-0 SH 27 (SUTURE)
SUT VIC AB 2-0 SH 27XBRD (SUTURE) IMPLANT
SUT VIC AB 2-0 UR6 27 (SUTURE) ×6 IMPLANT
SUT VICRYL 0 UR6 27IN ABS (SUTURE) IMPLANT
SUT VICRYL AB 2 0 TIE (SUTURE) IMPLANT
SUT VICRYL AB 2 0 TIES (SUTURE)
SYR 20CC LL (SYRINGE) ×2 IMPLANT
SYR BULB IRRIGATION 50ML (SYRINGE) ×2 IMPLANT
SYR CONTROL 10ML LL (SYRINGE) IMPLANT
TAPE CLOTH 3X10 TAN LF (GAUZE/BANDAGES/DRESSINGS) ×2 IMPLANT
TOWEL OR 17X24 6PK STRL BLUE (TOWEL DISPOSABLE) ×4 IMPLANT
TRAY DSU PREP LF (CUSTOM PROCEDURE TRAY) ×2 IMPLANT
TUBE CONNECTING 12X1/4 (SUCTIONS) ×2 IMPLANT
UNDERPAD 30X30 INCONTINENT (UNDERPADS AND DIAPERS) ×2 IMPLANT
YANKAUER SUCT BULB TIP NO VENT (SUCTIONS) ×2 IMPLANT

## 2017-10-03 NOTE — Anesthesia Preprocedure Evaluation (Addendum)
Anesthesia Evaluation  Patient identified by MRN, date of birth, ID band Patient awake    Reviewed: Allergy & Precautions, H&P , NPO status , Patient's Chart, lab work & pertinent test results, reviewed documented beta blocker date and time   History of Anesthesia Complications Negative for: history of anesthetic complications  Airway Mallampati: II  TM Distance: >3 FB Neck ROM: Full    Dental  (+) Dental Advisory Given, Teeth Intact   Pulmonary former smoker,    breath sounds clear to auscultation       Cardiovascular Exercise Tolerance: Good hypertension, Pt. on medications and Pt. on home beta blockers (-) Past MI  Rhythm:Regular Rate:Bradycardia  TTE 2018 - Moderate LVH. EF was in the range of 60%   to 65%. Dilated sinus of valsalva. Mild LA dilatation.   Neuro/Psych B/l hearing deficit; hx SAH negative psych ROS   GI/Hepatic negative GI ROS, neg GERD  ,(+) Hepatitis -, C  Endo/Other  neg diabetesHypothyroidism Obesity  Renal/GU negative Renal ROS     Musculoskeletal negative musculoskeletal ROS (+)   Abdominal   Peds  Hematology negative hematology ROS (+)   Anesthesia Other Findings   Reproductive/Obstetrics                           Anesthesia Physical  Anesthesia Plan  ASA: III  Anesthesia Plan: MAC   Post-op Pain Management:    Induction: Intravenous  PONV Risk Score and Plan: 2 and Treatment may vary due to age or medical condition, Propofol infusion and Midazolam  Airway Management Planned: Simple Face Mask and Natural Airway  Additional Equipment: None  Intra-op Plan:   Post-operative Plan:   Informed Consent: I have reviewed the patients History and Physical, chart, labs and discussed the procedure including the risks, benefits and alternatives for the proposed anesthesia with the patient or authorized representative who has indicated his/her understanding and  acceptance.   Dental advisory given  Plan Discussed with: CRNA  Anesthesia Plan Comments:       Anesthesia Quick Evaluation
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
## 2017-10-03 NOTE — Op Note (Addendum)
10/03/2017  3:41 PM  PATIENT:  Lonnie Jackson  61 y.o. male  Patient Care Team: Shon Baton, MD as PCP - General (Internal Medicine) Michael Boston, MD as Consulting Physician (General Surgery) Wilford Corner, MD as Consulting Physician (Gastroenterology)  PRE-OPERATIVE DIAGNOSIS:    HEMORRHOIDS PROLAPSING GRADE 3 WITH BLEEDING WITH PAIN LEFT POSTERIOR THIGH SKIN TAG  POST-OPERATIVE DIAGNOSIS:    HEMORRHOIDS PROLAPSING GRADE 2 & 3 WITH BLEEDING & PAIN Pruritis ani MILD ANAL STRICTURE LEFT POSTERIOR THIGH ACROCHORDON  PROCEDURE:    HEMORRHOIDAL LIGATION/PEXY HEMORRHOIDECTOMY X2 ANORECTAL EXAM UNDER ANESTHESIA WITH DILITATION REMOVAL LEFT POSTERIOR THIGH SKIN TAG     SURGEON:  [name], MD  ANESTHESIA:   General Anorectal & Local field block  0.25% bupivacaine with epinephrine at the beginning of the case. Liposomal bupivacaine (Experel) at the end of the case.  EBL:  Total I/O In: 200 [I.V.:200] Out: - .  See operative record  Delay start of Pharmacological VTE agent (>24hrs) due to surgical blood loss or risk of bleeding:  NO  DRAINS: NONE  SPECIMEN:   1. Internal hemorrhoidx2  2. Left posterior skin mass (probable acrochordon)  DISPOSITION OF SPECIMEN:  PATHOLOGY  COUNTS:  YES  PLAN OF CARE: Discharge home after PACU  PATIENT DISPOSITION:  PACU - hemodynamically stable.  INDICATION: Pleasant patient with struggles with hemorrhoids.  Not able to be managed in the office despite an improved bowel regimen.  Too sensitive and painful for endoscopic evaluation or treatment.  Suspicious for possible fissure.  More likely consistent with intermittently prolapsing hemorrhoids.  I recommended examination under anesthesia and surgical treatment.  Patient also has mass on posterior thigh.  Annoying and irritating.  He wished removal.  The anatomy & physiology of the anorectal region was discussed.  The pathophysiology of hemorrhoids and differential  diagnosis was discussed.  Natural history risks without surgery was discussed.   I stressed the importance of a bowel regimen to have daily soft bowel movements to minimize progression of disease.  Interventions such as sclerotherapy & banding were discussed.  The patient's symptoms are not adequately controlled by medicines and other non-operative treatments.  I feel the risks & problems of no surgery outweigh the operative risks; therefore, I recommended surgery to treat the hemorrhoids by ligation, pexy, and possible resection.  Risks such as bleeding, infection, need for further treatment, heart attack, death, and other risks were discussed.   I noted a good likelihood this will help address the problem.  Goals of post-operative recovery were discussed as well.  Possibility that this will not correct all symptoms was explained.  Post-operative pain, bleeding, constipation, urinary difficulties, and other problems after surgery were discussed.  We will work to minimize complications.   Educational handouts further explaining the pathology, treatment options, and bowel regimen were given as well.  Questions were answered.  The patient expresses understanding & wishes to proceed with surgery.  OR FINDINGS: Inflamed internal hemorrhoids.  Chronic scarring associated with it.  Right posterior > right anterior = left lateral.  Right posterior lateral easily prolapsing.  Mild pruritus and thickening suspicious for chronic prolapse of right posterior hemorrhoid.  Anal canal inflamed and scarred.  Some mild anal stricturing with it.  No increased sphincter tone.  No fissure.  Left anterior pedunculated anal canal polyp.  Most likely stretched out anal crypt.  Removed with left internal hemorrhoid  DESCRIPTION:   Informed consent was confirmed. Patient underwent general anesthesia without difficulty. Patient was placed into  prone positioning.  The perianal region was prepped and draped in sterile fashion.  Surgical time-out confirmed our plan.  I did digital rectal examination and then transitioned over to anoscopy to get a sense of the anatomy.  Findings noted above.   I excised the posterior thigh skin mass that his request.  Transverse incision.  2x1cm wound closed with interrupted 4-0 Monocryl suture.  Sterile dressing applied.  He initially was a little bit tight but I was able to get a large Hill-Ferguson without much difficulty.  He had some thickened skin that did crack.  I alternated exposure between the Hill-Ferguson and Parks retractor.  I proceeded to do hemorrhoidal ligation and pexy.  I used a 2-0 Vicryl suture on a UR-6 needle in a figure-of-eight fashion 6 cm proximal to the anal verge.  I started at the largest hemorrhoid pile.  Because of redundant hemorrhoidal tissue too bulky to merely ligate or pexy, I excised the excess internal hemorrhoid piles longitudinally in a fusiform biconcave fashion, at the  right posterior and left lateral locations, sparing the anal canal to avoid narrowing.  On the left side he had a pedunculated anal canal mass.  Most likely c/w stretched out anal crypt that was removed and removed with the internal hemorrhoid specimen.  I then ran that stitch longitudinally more distally to close the hemorrhoidectomy wound to the anal verge over a Parks self retaining retractor & occasionally a large Hill-Furgeson retractor to avoid narrowing of the anal canal.  I then tied that stitch down to cause a hemorrhoidopexy.   I then did hemorrhoidal ligation and pexy at the other 4 columns.  At the completion of this, all 6 anorectal columns were ligated and pexied in the classic hexagonal fashion (right anterior/lateral/posterior, left anterior/lateral/posterior).  I took care not to go distal to the anal verge since his anal canal was somewhat narrowed.  He did have some longitudinal radial cracking and fissuring at the anal verge from the dilation.  I closed the left lateral one  transversely to help do a mild anal plasty and keep it from restructuring down.  I redid anoscopy & examination.  At completion of this, all hemorrhoids had been removed or reduced into the rectum.  There is no more prolapse.  Internal & external anatomy was more more normal.  Hemostasis was good.  Fluffed gauze was on-laid over the perianal region.  No packing done.  Patient is being extubated go to go to the recovery room.  I had discussed postop care in detail with the patient in the preop holding area.  Instructions for post-operative recovery and prescriptions are written. I discussed operative findings, updated the patient's status, discussed probable steps to recovery, and gave postoperative recommendations to the patient's spouse.  Recommendations were made.  Questions were answered.  She expressed understanding & appreciation.  [name], M.D., F.A.C.S. Gastrointestinal and Minimally Invasive Surgery [facility] [address], [place], [place] [address] [phone] Main / Paging

## 2017-10-03 NOTE — Transfer of Care (Signed)
Immediate Anesthesia Transfer of Care Note  Patient: Lonnie Jackson  Procedure(s) Performed: Procedure(s) (LRB): HEMORRHOIDAL LIGATION/PEXY.  HEMORRHOIDECTOMY TIMES TWO, ANORECTAL EXAM UNDER ANESTHESIA, REMOVAL LEFT POSTERIOR THIGH SKIN TAG (N/A)  Patient Location: PACU  Anesthesia Type: General  Level of Consciousness: awake, sedated, patient cooperative and responds to stimulation  Airway & Oxygen Therapy: Patient Spontanous Breathing and Patient connected to [place] oxygen  Post-op Assessment: Report given to PACU RN, Post -op Vital signs reviewed and stable and Patient moving all extremities  Post vital signs: Reviewed and stable  Complications: No apparent anesthesia complications

## 2017-10-03 NOTE — Anesthesia Procedure Notes (Signed)
Procedure Name: MAC Date/Time: 10/03/2017 2:27 PM Performed by: Justice Rocher, CRNA Pre-anesthesia Checklist: Patient identified, Timeout performed, Emergency Drugs available, Suction available and Patient being monitored Patient Re-evaluated:Patient Re-evaluated prior to induction Oxygen Delivery Method: Simple face mask Preoxygenation: Pre-oxygenation with 100% oxygen Induction Type: IV induction Placement Confirmation: breath sounds checked- equal and bilateral and positive ETCO2

## 2017-10-03 NOTE — Discharge Instructions (Signed)
ANORECTAL SURGERY:  POST OPERATIVE INSTRUCTIONS  ######################################################################  EAT Start with a pureed / full liquid diet After 24 hours, gradually transition to a high fiber diet.    CONTROL PAIN Control pain so you can tolerate bowel movements,  walk, sleep, tolerate sneezing/coughing, and go up/down stairs.   HAVE A BOWEL MOVEMENT DAILY Keep your bowels regular to avoid problems.   Taking a fiber supplement every day to keep bowels soft.   Try a laxative to override constipation. Use an antidairrheal to slow down diarrhea.   Call if not better after 2 tries  WALK Walk an hour a day.  Control your pain to do that.   CALL IF YOU HAVE PROBLEMS/CONCERNS Call if you are still struggling despite following these instructions. Call if you have concerns not answered by these instructions  ######################################################################    1. Take your usually prescribed home medications unless otherwise directed. 2. DIET: Follow a light bland diet the first 24 hours after arrival home, such as soup, liquids, crackers, etc.  Be sure to include lots of fluids daily.  Avoid fast food or heavy meals as your are more likely to get nauseated.  Eat a low fat the next few days after surgery.   3. PAIN CONTROL: a. Pain is best controlled by a usual combination of three different methods TOGETHER: i. Ice/Heat ii. Over the counter pain medication iii. Prescription pain medication b. Expect swelling and discomfort in the anus/rectal area.  Warm water baths (30-60 minutes up to 6 times a day, especially after bowel meovements) will help. Use ice for the first few days to help decrease swelling and bruising, then switch to heat such as warm towels, sitz baths, warm baths, etc to help relax tight/sore spots and speed recovery.  Some people prefer to use ice alone, heat alone, alternating between ice & heat.  Experiment to what works  for you.   c. It is helpful to take an over-the-counter pain medication regularly for the first few weeks.  Choose one of the following that works best for you: i. Naproxen (Aleve, etc)  Two 220mg  tabs twice a day ii. Ibuprofen (Advil, etc) Three 200mg  tabs four times a day (every meal & bedtime) iii. Acetaminophen (Tylenol, etc) 500-650mg  four times a day (every meal & bedtime) d. A  prescription for pain medication (such as oxycodone, hydrocodone, etc) should be given to you upon discharge.  Take your pain medication as prescribed.  i. If you are having problems/concerns with the prescription medicine (does not control pain, nausea, vomiting, rash, itching, etc), please call us [phone] to see if we need to switch you to a different pain medicine that will work better for you and/or control your side effect better. ii. If you need a refill on your pain medication, please contact your pharmacy.  They will contact our office to request authorization. Prescriptions will not be filled after 5 pm or on week-ends.  Use a Sitz Bath 4-8 times a day for relief   [employer] A sitz bath is a warm water bath taken in the sitting position that covers only the hips and buttocks. It may be used for either healing or hygiene purposes. Sitz baths are also used to relieve pain, itching, or muscle spasms. The water may contain medicine. Moist heat will help you heal and relax.  HOME CARE INSTRUCTIONS  Take 3 to 4 sitz baths a day. 1. Fill the bathtub half full with warm water. 2. Sit in the  water and open the drain a little. 3. Turn on the warm water to keep the tub half full. Keep the water running constantly. 4. Soak in the water for 15 to 20 minutes. 5. After the sitz bath, pat the affected area dry first.   4. KEEP YOUR BOWELS REGULAR a. The goal is one bowel movement a day b. Avoid getting constipated.  Between the surgery and the pain medications, it is common to experience some constipation.   Increasing fluid intake and taking a fiber supplement (such as Metamucil, Citrucel, FiberCon, MiraLax, etc) 2-3 times a day regularly will usually help prevent this problem from occurring.  A mild laxative (prune juice, Milk of Magnesia, MiraLax, etc) should be taken according to package directions if there are no bowel movements after 48 hours. c. Watch out for diarrhea.  If you have many loose bowel movements, simplify your diet to bland foods & liquids for a few days.  Stop any stool softeners and decrease your fiber supplement.  Switching to mild anti-diarrheal medications (Kayopectate, Pepto Bismol) can help.  If this worsens or does not improve, please call us.  5. Wound Care  a. Remove your bandages with your first bowel movement, usually the day after surgery.  You may have packing if you had an abscess.  Let any packing or gauze fall come out.   b. Wear an absorbent pad or soft cotton balls in your underwear as needed to catch any drainage and help keep the area  c. Keep the area clean and dry.  Bathe / shower every day.  Keep the area clean by showering / bathing over the incision / wound.   It is okay to soak an open wound to help wash it.  Consider using a squeeze bottle filled with warm water to gently wash the anal area.  Wet wipes or showers / gentle washing after bowel movements is often less traumatic than regular toilet paper. d. Dennis Bast will often notice bleeding with bowel movements.  This should slow down by the end of the first week of surgery.  Sitting on an ice pack can help. e. Expect some drainage.  This should slow down by the end of the first week of surgery, but you will have occasional bleeding or drainage up to a few months after surgery.  Wear an absorbent pad or soft cotton gauze in your underwear until the drainage stops.  6. ACTIVITIES as tolerated:   a. You may resume regular (light) daily activities beginning the next day--such as daily self-care, walking, climbing  stairs--gradually increasing activities as tolerated.  If you can walk 30 minutes without difficulty, it is safe to try more intense activity such as jogging, treadmill, bicycling, low-impact aerobics, swimming, etc. b. Save the most intensive and strenuous activity for last such as sit-ups, heavy lifting, contact sports, etc  Refrain from any heavy lifting or straining until you are off narcotics for pain control.   c. DO NOT PUSH THROUGH PAIN.  Let pain be your guide: If it hurts to do something, don't do it.  Pain is your body warning you to avoid that activity for another week until the pain goes down. d. You may drive when you are no longer taking prescription pain medication, you can comfortably sit for long periods of time, and you can safely maneuver your car and apply brakes. e. Dennis Bast may have sexual intercourse when it is comfortable.  7. FOLLOW UP in our office a. Please call [facility] at (  [phone] to set up an appointment to see your surgeon in the office for a follow-up appointment approximately 2-3 weeks after your surgery. b. Make sure that you call for this appointment the day you arrive home to ensure a convenient appointment time.  8. IF YOU HAVE DISABILITY OR FAMILY LEAVE FORMS, BRING THEM TO THE OFFICE FOR PROCESSING.  DO NOT GIVE THEM TO YOUR DOCTOR.        WHEN TO CALL us [phone]: 1. Poor pain control 2. Reactions / problems with new medications (rash/itching, nausea, etc)  3. Fever over 101.5 F (38.5 C) 4. Inability to urinate 5. Nausea and/or vomiting 6. Worsening swelling or bruising 7. Continued bleeding from incision. 8. Increased pain, redness, or drainage from the incision  The clinic staff is available to answer your questions during regular business hours (8:30am-5pm).  Please dont hesitate to call and ask to speak to one of our nurses for clinical concerns.   A surgeon from [facility] is always on call at the hospitals   If you have a  medical emergency, go to the nearest emergency room or call 911.    [facility], [place], [place], [place], [place]  [address] ? MAIN: [phone] ? TOLL FREE: [phone] ? FAX [phone] [email or web address]    HEMORRHOIDS  The rectum is the last foot of your colon, and it naturally stretches to hold stool.  Hemorrhoidal piles are natural clusters of blood vessels that help the rectum and anal canal stretch to hold stool and allow bowel movements to eliminate feces.   Hemorrhoids are abnormally swollen blood vessels in the rectum.  Too much pressure in the rectum causes hemorrhoids by forcing blood to stretch and bulge the walls of the veins, sometimes even rupturing them.  Hemorrhoids can become like varicose veins you might see on a person's legs.  Most people will develop a flare of hemorrhoids in their lifetime.  When bulging hemorrhoidal veins are irritated, they can swell, burn, itch, cause pain, and bleed.  Most flares will calm down gradually own within a few weeks.  However, once hemorrhoids are created, they are difficult to get rid of completely and tend to flare more easily than the first flare.   Fortunately, good habits and simple medical treatment usually control hemorrhoids well, and surgery is needed only in severe cases. Types of Hemorrhoids:  Internal hemorrhoids usually don't initially hurt or itch; they are deep inside the rectum and usually have no sensation. If they begin to push out (prolapse), pain and burning can occur.  However, internal hemorrhoids can bleed.  Anal bleeding should not be ignored since bleeding could come from a dangerous source like colorectal cancer, so persistent rectal bleeding should be investigated by a doctor, sometimes with a colonoscopy.  External hemorrhoids cause most of the symptoms - pain, burning, and itching. Nonirritated hemorrhoids can look like small skin tags coming out of the anus.     Thrombosed hemorrhoids can form when a hemorrhoid blood vessel bursts and causes the hemorrhoid to suddenly swell.  A purple blood clot can form in it and become an excruciatingly painful lump at the anus. Because of these unpleasant symptoms, immediate incision and drainage by a surgeon at an office visit can provide much relief of the pain.    PREVENTION Avoiding the most frequent causes listed below will prevent most cases of hemorrhoids: Constipation Hard stools Diarrhea  Constant sitting  Straining with bowel movements  Sitting on the toilet for a long time  Severe coughing  episodes Pregnancy / Childbirth  Heavy Lifting  Sometimes avoiding the above triggers is difficult:  How can you avoid sitting all day if you have a seated job? Also, we try to avoid coughing and diarrhea, but sometimes its beyond your control.  Still, there are some practical hints to help: Keep the anal and genital area clean.  Moistened tissues such as flushable wet wipes are less irritating than toilet paper.  Using irrigating showers or bottle irrigation washing gently cleans this sensitive area.   Avoid dry toilet paper when cleaning after bowel movements.  Marland Kitchen Keep the anal and genital area dry.  Lightly pat the rectal area dry.  Avoid rubbing.  Talcum or baby powders can help GET YOUR STOOLS SOFT.   This is the most important way to prevent irritated hemorrhoids.  Hard stools are like sandpaper to the anorectal canal and will cause more problems.  The goal: ONE SOFT BOWEL MOVEMENT A DAY!  BMs from every other day to 3 times a day is a tolerable range Treat coughing, diarrhea and constipation early since irritated hemorrhoids may soon follow.  If your main job activity is seated, always stand or walk during your breaks. Make it a point to stand and walk at least 5 minutes every hour and try to shift frequently in your chair to avoid direct rectal pressure.  Always exhale as you strain or lift. Don't hold your  breath.  Do not delay or try to prevent a bowel movement when the urge is present. Exercise regularly (walking or jogging 60 minutes a day) to stimulate the bowels to move. No reading or other activity while on the toilet. If bowel movements take longer than 5 minutes, you are too constipated. AVOID CONSTIPATION Drink plenty of liquids (1 1/2 to 2 quarts of water and other fluids a day unless fluid restricted for another medical condition). Liquids that contain caffeine (coffee a, tea, soft drinks) can be dehydrating and should be avoided until constipation is controlled. Consider minimizing milk, as dairy products may be constipating. Eat plenty of fiber (30g a day ideal, more if needed).  Fiber is the undigested part of plant food that passes into the colon, acting as natures broom to encourage bowel motility and movement.  Fiber can absorb and hold large amounts of water. This results in a larger, bulkier stool, which is soft and easier to pass.  Eating foods high in fiber - 12 servings - such as  Vegetables: Root (potatoes, carrots, turnips), Leafy green (lettuce, salad greens, celery, spinach), High residue (cabbage, broccoli, etc.) Fruit: Fresh, Dried (prunes, apricots, cherries), Stewed (applesauce)  Whole grain breads, pasta, whole wheat Bran cereals, muffins, etc. Consider adding supplemental bulking fiber which retains large volumes of water: Psyllium ground seeds (native plant from [place])--available as Metamucil, Konsyl, Effersyllium, Per Diem Fiber, or the less expensive generic forms.  Citrucel  (methylcellulose wood fiber) . FiberCon (Polycarbophil) Polyethylene Glycol - and artificial fiber commonly called Miralax or Glycolax.  It is helpful for people with gassy or bloated feelings with regular fiber Flax Seed - a less gassy natural fiber  Laxatives can be useful for a short period if constipation is severe Osmotics (Milk of Magnesia, Fleets Phospho-Soda, Magnesium  Citrate)  Stimulants (Senokot,   Castor Oil,  Dulcolax, Ex-Lax)    Laxatives are not a good long-term solution as it can stress the bowels and cause too much mineral loss and  dehydration.   Avoid taking laxatives for more than 7 days in a row.  AVOID DIARRHEA Switch to liquids and simpler foods for a few days to avoid stressing your intestines further. Avoid dairy products (especially milk & ice cream) for a short time.  The intestines often can lose the ability to digest lactose when stressed. Avoid foods that cause gassiness or bloating.  Typical foods include beans and other legumes, cabbage, broccoli, and dairy foods.  Every person has some sensitivity to other foods, so listen to your body and avoid those foods that trigger problems for you. Adding fiber (Citrucel, Metamucil, FiberCon, Flax seed, Miralax) gradually can help thicken stools by absorbing excess fluid and retrain the intestines to act more normally.  Slowly increase the dose over a few weeks.  Too much fiber too soon can backfire and cause cramping & bloating. Probiotics (such as active yogurt, Align, etc) may help repopulate the intestines and colon with normal bacteria and calm down a sensitive digestive tract.  Most studies show it to be of mild help, though, and such products can be costly. Medicines: Bismuth subsalicylate (ex. Kayopectate, Pepto Bismol) every 30 minutes for up to 6 doses can help control diarrhea.  Avoid if pregnant. Loperamide (Immodium) can slow down diarrhea.  Start with two tablets (4mg  total) first and then try one tablet every 6 hours.  Avoid if you are having fevers or severe pain.  If you are not better or start feeling worse, stop all medicines and call your doctor for advice Call your doctor if you are getting worse or not better.  Sometimes further testing (cultures, endoscopy, X-ray studies, bloodwork, etc) may be needed to help diagnose and treat the cause of the diarrhea.  TROUBLESHOOTING IRREGULAR  BOWELS 1) Avoid extremes of bowel movements (no bad constipation/diarrhea) 2) Miralax 17gm mixed in 8oz. water or juice-daily. May use BID as needed.  3) Gas-x,Phazyme, etc. as needed for gas & bloating.  4) Soft,bland diet. No spicy,greasy,fried foods.  5) Prilosec over-the-counter as needed  6) May hold gluten/wheat products from diet to see if symptoms improve.  7)  May try probiotics (Align, Activa, etc) to help calm the bowels down 7) If symptoms become worse call back immediately.   TREATMENT OF HEMORRHOID FLARE If these preventive measures fail, you must take action right away! Hemorrhoids are one condition that can be mild in the morning and become intolerable by nightfall. Most hemorrhoidal flares take several weeks to calm down.  These suggestions can help: Warm soaks.  This helps more than any topical medication.  Use up to 8 times a day.  Usually sitz baths or sitting in a warm bathtub helps.  Sitting on moist warm towels are helpful.  Switching to ice packs/cool compresses can be helpful  Use a Sitz Bath 4-8 times a day for relief A sitz bath is a warm water bath taken in the sitting position that covers only the hips and buttocks. It may be used for either healing or hygiene purposes. Sitz baths are also used to relieve pain, itching, or muscle spasms. The water may contain medicine. Moist heat will help you heal and relax.  HOME CARE INSTRUCTIONS  Take 3 to 4 sitz baths a day. 6. Fill the bathtub half full with warm water. 7. Sit in the water and open the drain a little. 8. Turn on the warm water to keep the tub half full. Keep the water running constantly. 9. Soak in the water for 15  to 20 minutes. 10. After the sitz bath, pat the affected area dry first. SEEK MEDICAL CARE IF:  You get worse instead of better. Stop the sitz baths if you get worse.  Normalize your bowels.  Extremes of diarrhea or constipation will make hemorrhoids worse.  One soft bowel movement a day is  the goal.  Fiber can help get your bowels regular Wet wipes instead of toilet paper Pain control with a NSAID such as ibuprofen (Advil) or naproxen (Aleve) or acetaminophen (Tylenol) around the clock.  Narcotics are constipating and should be minimized if possible Topical creams contain steroids (bydrocortisone) or local anesthetic (xylocaine) can help make pain and itching more tolerable.   EVALUATION If hemorrhoids are still causing problems, you could benefit by an evaluation by a surgeon.  The surgeon will obtain a history and examine you.  If hemorrhoids are diagnosed, some therapies can be offered in the office, usually with an anoscope into the less sensitive area of the rectum: -injection of hemorrhoids (sclerotherapy) can scar the blood vessels of the swollen/enlarged hemorrhoids to help shrink them down to a more normal size -rubber banding of the enlarged hemorrhoids to help shrink them down to a more normal size -drainage of the blood clot causing a thrombosed hemorrhoid,  to relieve the severe pain   While 90% of the time such problems from hemorrhoids can be managed without preceding to surgery, sometimes the hemorrhoids require a operation to control the problem (uncontrolled bleeding, prolapse, pain, etc.).   This involves being placed under general anesthesia where the surgeon can confirm the diagnosis and remove, suture, or staple the hemorrhoid(s).  Your surgeon can help you treat the problem appropriately.    Post Anesthesia Home Care Instructions  Activity: Get plenty of rest for the remainder of the day. A responsible individual must stay with you for 24 hours following the procedure.  For the next 24 hours, DO NOT: -Drive a car -Paediatric nurse -Drink alcoholic beverages -Take any medication unless instructed by your physician -Make any legal decisions or sign important papers.  Meals: Start with liquid foods such as gelatin or soup. Progress to regular foods as  tolerated. Avoid greasy, spicy, heavy foods. If nausea and/or vomiting occur, drink only clear liquids until the nausea and/or vomiting subsides. Call your physician if vomiting continues.  Special Instructions/Symptoms: Your throat may feel dry or sore from the anesthesia or the breathing tube placed in your throat during surgery. If this causes discomfort, gargle with warm salt water. The discomfort should disappear within 24 hours.  If you had a scopolamine patch placed behind your ear for the management of post- operative nausea and/or vomiting:  1. The medication in the patch is effective for 72 hours, after which it should be removed.  Wrap patch in a tissue and discard in the trash. Wash hands thoroughly with soap and water. 2. You may remove the patch earlier than 72 hours if you experience unpleasant side effects which may include dry mouth, dizziness or visual disturbances. 3. Avoid touching the patch. Wash your hands with soap and water after contact with the patch.

## 2017-10-04 ENCOUNTER — Encounter (HOSPITAL_BASED_OUTPATIENT_CLINIC_OR_DEPARTMENT_OTHER): Payer: Self-pay | Admitting: Surgery

## 2017-10-04 NOTE — Anesthesia Postprocedure Evaluation (Deleted)
Anesthesia Post Note  Patient: Lonnie Jackson  Procedure(s) Performed: HEMORRHOIDAL LIGATION/PEXY.  HEMORRHOIDECTOMY TIMES TWO, ANORECTAL EXAM UNDER ANESTHESIA, REMOVAL LEFT POSTERIOR THIGH SKIN TAG (N/A Rectum)     Patient location during evaluation: PACU Anesthesia Type: General Level of consciousness: awake and alert Pain management: pain level controlled Vital Signs Assessment: post-procedure vital signs reviewed and stable Respiratory status: spontaneous breathing, nonlabored ventilation, respiratory function stable and patient connected to nasal cannula oxygen Cardiovascular status: stable and blood pressure returned to baseline Postop Assessment: no apparent nausea or vomiting Anesthetic complications: no    Last Vitals:  Vitals:   10/03/17 1630 10/03/17 1824  BP: (!) 148/89 (!) 166/83  Pulse: (!) 50 (!) 49  Resp: 14 14  Temp:  36.7 C  SpO2: 94% 97%    Last Pain:  Vitals:   10/03/17 1815  TempSrc:   PainSc: 0-No pain                 Hiedi Touchton S

## 2017-10-04 NOTE — Anesthesia Postprocedure Evaluation (Deleted)
Anesthesia Post Note  Patient: [name]  Procedure(s) Performed: HEMORRHOIDAL LIGATION/PEXY.  HEMORRHOIDECTOMY TIMES TWO, ANORECTAL EXAM UNDER ANESTHESIA, REMOVAL LEFT POSTERIOR THIGH SKIN TAG (N/A Rectum)     Patient location during evaluation: PACU Anesthesia Type: General Level of consciousness: awake and alert Pain management: pain level controlled Vital Signs Assessment: post-procedure vital signs reviewed and stable Respiratory status: spontaneous breathing, nonlabored ventilation, respiratory function stable and patient connected to nasal cannula oxygen Cardiovascular status: blood pressure returned to baseline and stable Postop Assessment: no apparent nausea or vomiting Anesthetic complications: no    Last Vitals:  Vitals:   10/03/17 1630 10/03/17 1824  BP: (!) 148/89 (!) 166/83  Pulse: (!) 50 (!) 49  Resp: 14 14  Temp:  36.7 C  SpO2: 94% 97%    Last Pain:  Vitals:   10/03/17 1815  TempSrc:   PainSc: 0-No pain                 Yania Bogie S

## 2017-10-04 NOTE — Anesthesia Postprocedure Evaluation (Signed)
Anesthesia Post Note  Patient: Lonnie Jackson  Procedure(s) Performed: HEMORRHOIDAL LIGATION/PEXY.  HEMORRHOIDECTOMY TIMES TWO, ANORECTAL EXAM UNDER ANESTHESIA, REMOVAL LEFT POSTERIOR THIGH SKIN TAG (N/A Rectum)     Patient location during evaluation: PACU Anesthesia Type: MAC Level of consciousness: awake and alert Pain management: pain level controlled Vital Signs Assessment: post-procedure vital signs reviewed and stable Respiratory status: spontaneous breathing, nonlabored ventilation, respiratory function stable and patient connected to nasal cannula oxygen Cardiovascular status: stable and blood pressure returned to baseline Postop Assessment: no apparent nausea or vomiting Anesthetic complications: no    Last Vitals:  Vitals:   10/03/17 1630 10/03/17 1824  BP: (!) 148/89 (!) 166/83  Pulse: (!) 50 (!) 49  Resp: 14 14  Temp:  36.7 C  SpO2: 94% 97%    Last Pain:  Vitals:   10/03/17 1815  TempSrc:   PainSc: 0-No pain                 Keena Dinse S

## 2017-10-04 NOTE — Addendum Note (Signed)
Addendum  created 10/04/17 1456 by Myrtie Soman, MD   Delete clinical note, Sign clinical note

## 2017-11-22 DIAGNOSIS — R748 Abnormal levels of other serum enzymes: Secondary | ICD-10-CM | POA: Diagnosis not present

## 2017-12-17 DIAGNOSIS — R197 Diarrhea, unspecified: Secondary | ICD-10-CM | POA: Diagnosis not present

## 2017-12-17 DIAGNOSIS — R748 Abnormal levels of other serum enzymes: Secondary | ICD-10-CM | POA: Diagnosis not present

## 2018-01-27 DIAGNOSIS — R748 Abnormal levels of other serum enzymes: Secondary | ICD-10-CM | POA: Diagnosis not present

## 2018-02-13 DIAGNOSIS — R748 Abnormal levels of other serum enzymes: Secondary | ICD-10-CM | POA: Diagnosis not present

## 2018-02-13 DIAGNOSIS — K7460 Unspecified cirrhosis of liver: Secondary | ICD-10-CM | POA: Diagnosis not present

## 2018-03-12 ENCOUNTER — Other Ambulatory Visit: Payer: Self-pay | Admitting: Gastroenterology

## 2018-03-12 DIAGNOSIS — K7460 Unspecified cirrhosis of liver: Secondary | ICD-10-CM

## 2018-03-17 DIAGNOSIS — E038 Other specified hypothyroidism: Secondary | ICD-10-CM | POA: Diagnosis not present

## 2018-03-17 DIAGNOSIS — R740 Nonspecific elevation of levels of transaminase and lactic acid dehydrogenase [LDH]: Secondary | ICD-10-CM | POA: Diagnosis not present

## 2018-03-17 DIAGNOSIS — I10 Essential (primary) hypertension: Secondary | ICD-10-CM | POA: Diagnosis not present

## 2018-03-17 DIAGNOSIS — R5381 Other malaise: Secondary | ICD-10-CM | POA: Diagnosis not present

## 2018-03-19 DIAGNOSIS — N390 Urinary tract infection, site not specified: Secondary | ICD-10-CM | POA: Diagnosis not present

## 2018-03-19 DIAGNOSIS — I10 Essential (primary) hypertension: Secondary | ICD-10-CM | POA: Diagnosis not present

## 2018-03-19 DIAGNOSIS — R51 Headache: Secondary | ICD-10-CM | POA: Diagnosis not present

## 2018-03-19 DIAGNOSIS — R5383 Other fatigue: Secondary | ICD-10-CM | POA: Diagnosis not present

## 2018-03-24 ENCOUNTER — Ambulatory Visit
Admission: RE | Admit: 2018-03-24 | Discharge: 2018-03-24 | Disposition: A | Discharge status: Home / Self Care | Payer: 59 | Source: Ambulatory Visit | Attending: Gastroenterology | Admitting: Gastroenterology

## 2018-03-24 DIAGNOSIS — K7460 Unspecified cirrhosis of liver: Secondary | ICD-10-CM

## 2018-03-24 DIAGNOSIS — K7689 Other specified diseases of liver: Secondary | ICD-10-CM | POA: Diagnosis not present

## 2018-04-17 DIAGNOSIS — R0989 Other specified symptoms and signs involving the circulatory and respiratory systems: Secondary | ICD-10-CM | POA: Diagnosis not present

## 2018-04-22 DIAGNOSIS — R82998 Other abnormal findings in urine: Secondary | ICD-10-CM | POA: Diagnosis not present

## 2018-04-22 DIAGNOSIS — N529 Male erectile dysfunction, unspecified: Secondary | ICD-10-CM | POA: Diagnosis not present

## 2018-04-22 DIAGNOSIS — R0989 Other specified symptoms and signs involving the circulatory and respiratory systems: Secondary | ICD-10-CM | POA: Diagnosis not present

## 2018-04-22 DIAGNOSIS — Z0000 Encounter for general adult medical examination without abnormal findings: Secondary | ICD-10-CM | POA: Diagnosis not present

## 2018-04-25 ENCOUNTER — Ambulatory Visit (INDEPENDENT_AMBULATORY_CARE_PROVIDER_SITE_OTHER): Payer: 59 | Admitting: Cardiovascular Disease

## 2018-04-25 ENCOUNTER — Encounter: Payer: Self-pay | Admitting: Cardiovascular Disease

## 2018-04-25 VITALS — BP 96/80 | HR 64 | Ht 70.0 in | Wt 215.0 lb

## 2018-04-25 DIAGNOSIS — I10 Essential (primary) hypertension: Secondary | ICD-10-CM | POA: Diagnosis not present

## 2018-04-25 MED ORDER — AMLODIPINE BESYLATE 5 MG PO TABS: 5.0000 mg | ORAL_TABLET | Freq: Every day | ORAL | 6 refills | Status: DC

## 2018-04-25 NOTE — Patient Instructions (Signed)
Medication Instructions: Your physician recommends that you continue on your current medications as directed. Please refer to the Current Medication list given to you today.  Decrease Amlodipine to 5 mg daily.  Follow-Up: Your physician recommends that you schedule a follow-up appointment in: 1 month with PharmD in [facility] Clinic. Your physician has requested that you regularly monitor and record your blood pressure readings at home. Please use the same machine at the same time of day to check your readings and record them to bring to your follow-up visit.  Your physician recommends that you schedule a follow-up appointment in: 3 months with Dr. Gwenlyn Found.  If you need a refill on your cardiac medications before your next appointment, please call your pharmacy.

## 2018-04-25 NOTE — Progress Notes (Signed)
04/25/2018 Lonnie Jackson   [birth year]  [id number]  Primary Physician Lonnie Baton, MD Primary Cardiologist: Lorretta Harp MD Lonnie Jackson, [place]  HPI:  Lonnie Jackson is a 62 y.o. moderately overweight married Caucasian male father of 2, grandfather 3 grandchildren who is accompanied by his wife Lonnie Jackson today.  He works for [employer].  He was referred by Dr. Virgina Jackson for hypertension.  His cardiac risk factors include over 100-pack-year history tobacco abuse having quit 9 years ago.  He stopped drinking 40 years ago as well.  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  He had hypertension for the last 14 years since 2005.  He is on 3 antihypertensive medications.  2D echo performed 07/24/2017 revealed normal LV systolic function with moderate LVH.  He denies chest pain or shortness of breath.  Never had a heart attack or stroke.   Current Meds  Medication Sig  . amLODipine (NORVASC) 5 MG tablet Take 1 tablet (5 mg total) by mouth daily.  Marland Kitchen levothyroxine (SYNTHROID, LEVOTHROID) 50 MCG tablet Take 50 mcg by mouth daily before breakfast.   . metoprolol succinate (TOPROL-XL) 25 MG 24 hr tablet Take 25 mg by mouth daily.  . naproxen (NAPROSYN) 500 MG tablet Take 1 tablet (500 mg total) by mouth every 12 (twelve) hours as needed for mild pain or moderate pain.  Marland Kitchen olmesartan (BENICAR) 40 MG tablet Take 1 tablet by mouth daily.  . [DISCONTINUED] amLODipine (NORVASC) 10 MG tablet Take 10 mg by mouth daily.     No Known Allergies  Social History   Socioeconomic History  . Marital status: Married    Spouse name: Not on file  . Number of children: Not on file  . Years of education: Not on file  . Highest education level: Not on file  Occupational History  . Not on file  Social Needs  . Financial resource strain: Not on file  . Food insecurity:    Worry: Not on file    Inability: Not on file  . Transportation needs:    Medical: Not on file    Non-medical: Not on file    Tobacco Use  . Smoking status: Former Smoker    Years: 40.00    Types: Cigarettes    Last attempt to quit: 09/25/2009    Years since quitting: 8.5  . Smokeless tobacco: Never Used  Substance and Sexual Activity  . Alcohol use: No  . Drug use: No  . Sexual activity: Not on file  Lifestyle  . Physical activity:    Days per week: Not on file    Minutes per session: Not on file  . Stress: Not on file  Relationships  . Social connections:    Talks on phone: Not on file    Gets together: Not on file    Attends religious service: Not on file    Active member of club or organization: Not on file    Attends meetings of clubs or organizations: Not on file    Relationship status: Not on file  . Intimate partner violence:    Fear of current or ex partner: Not on file    Emotionally abused: Not on file    Physically abused: Not on file    Forced sexual activity: Not on file  Other Topics Concern  . Not on file  Social History Narrative  . Not on file     Review of Systems: General: negative for  chills, fever, night sweats or weight changes.  Cardiovascular: negative for chest pain, dyspnea on exertion, edema, orthopnea, palpitations, paroxysmal nocturnal dyspnea or shortness of breath Dermatological: negative for rash Respiratory: negative for cough or wheezing Urologic: negative for hematuria Abdominal: negative for nausea, vomiting, diarrhea, bright red blood per rectum, melena, or hematemesis Neurologic: negative for visual changes, syncope, or dizziness All other systems reviewed and are otherwise negative except as noted above.    Blood pressure 96/80, pulse 64, height 5\' 10"  (1.778 m), weight 215 lb (97.5 kg).  General appearance: alert and no distress Neck: no adenopathy, no carotid bruit, no JVD, supple, symmetrical, trachea midline and thyroid not enlarged, symmetric, no tenderness/mass/nodules Lungs: clear to auscultation bilaterally Heart: regular rate and rhythm,  S1, S2 normal, no murmur, click, rub or gallop Extremities: extremities normal, atraumatic, no cyanosis or edema Pulses: 2+ and symmetric Skin: Skin color, texture, turgor normal. No rashes or lesions Neurologic: Alert and oriented X 3, normal strength and tone. Normal symmetric reflexes. Normal coordination and gait  EKG sinus rhythm at 64 early re-pole changes.  I personally reviewed this EKG  ASSESSMENT AND PLAN:   Essential hypertension History of essential hypertension 2005 on multiple antihypertensive medications including olmesartan, metoprolol amlodipine.  His blood pressure today is 96/80.  He does have moderate LVH on 2D echo performed 07/24/2017.  I am going to begin to wean him off some of his blood pressure medicines and will decrease his amlodipine today from 10 to 5 mg.  He will keep a blood pressure log for the next 30 days and see Lonnie Jackson back for review and blood pressure medicine down titration.      Lorretta Harp MD FACP,FACC,FAHA, [facility] 04/25/2018 12:17 PM

## 2018-04-25 NOTE — Assessment & Plan Note (Signed)
History of essential hypertension 2005 on multiple antihypertensive medications including olmesartan, metoprolol amlodipine.  His blood pressure today is 96/80.  He does have moderate LVH on 2D echo performed 07/24/2017.  I am going to begin to wean him off some of his blood pressure medicines and will decrease his amlodipine today from 10 to 5 mg.  He will keep a blood pressure log for the next 30 days and see Kristen back for review and blood pressure medicine down titration.

## 2018-05-16 DIAGNOSIS — H2513 Age-related nuclear cataract, bilateral: Secondary | ICD-10-CM | POA: Diagnosis not present

## 2018-06-03 DIAGNOSIS — M67911 Unspecified disorder of synovium and tendon, right shoulder: Secondary | ICD-10-CM | POA: Diagnosis not present

## 2018-06-03 DIAGNOSIS — M7521 Bicipital tendinitis, right shoulder: Secondary | ICD-10-CM | POA: Diagnosis not present

## 2018-06-05 ENCOUNTER — Ambulatory Visit (INDEPENDENT_AMBULATORY_CARE_PROVIDER_SITE_OTHER): Payer: 59 | Admitting: Pharmacist

## 2018-06-05 VITALS — BP 112/80 | HR 70

## 2018-06-05 DIAGNOSIS — I10 Essential (primary) hypertension: Secondary | ICD-10-CM | POA: Diagnosis not present

## 2018-06-05 NOTE — Patient Instructions (Addendum)
Return for a follow up appointment in 4 weeks (2 weeks follow up by phone)  Check your blood pressure at home daily (if able) and keep record of the readings.  Take your BP meds as follows: *DECREASE metoprolol to 12.5mg  daily*  Bring all of your meds, your BP cuff and your record of home blood pressures to your next appointment.  Exercise as you're able, try to walk approximately 30 minutes per day.  Keep salt intake to a minimum, especially watch canned and prepared boxed foods.  Eat more fresh fruits and vegetables and fewer canned items.  Avoid eating in fast food restaurants.    HOW TO TAKE YOUR BLOOD PRESSURE: . Rest 5 minutes before taking your blood pressure. .  Don't smoke or drink caffeinated beverages for at least 30 minutes before. . Take your blood pressure before (not after) you eat. . Sit comfortably with your back supported and both feet on the floor (don't cross your legs). . Elevate your arm to heart level on a table or a desk. . Use the proper sized cuff. It should fit smoothly and snugly around your bare upper arm. There should be enough room to slip a fingertip under the cuff. The bottom edge of the cuff should be 1 inch above the crease of the elbow. . Ideally, take 3 measurements at one sitting and record the average.
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
## 2018-06-09 ENCOUNTER — Encounter: Payer: Self-pay | Admitting: Pharmacist

## 2018-06-09 NOTE — Assessment & Plan Note (Signed)
Blood pressure remains at goal today but noted his diastolic pressure is in the highest part of desired goal.  I will decrease his metoprolol from 25mg  to 12.5mg  daily due to increased fatigue and desire to take less medication. Patient is to work on decreasing sodium intake and increasing cardio during workouts.  Plan to follow up in 2 weeks by phone to determine if appropriate to discontinue BB, then f/u in 4 week at the HTN clinic.

## 2018-07-03 ENCOUNTER — Ambulatory Visit (INDEPENDENT_AMBULATORY_CARE_PROVIDER_SITE_OTHER): Payer: 59 | Admitting: Pharmacist Clinician (PhC)/ Clinical Pharmacy Specialist

## 2018-07-03 ENCOUNTER — Encounter: Payer: Self-pay | Admitting: Pharmacist Clinician (PhC)/ Clinical Pharmacy Specialist

## 2018-07-03 DIAGNOSIS — I10 Essential (primary) hypertension: Secondary | ICD-10-CM

## 2018-07-03 MED ORDER — OLMESARTAN MEDOXOMIL 20 MG PO TABS: 20.0000 mg | ORAL_TABLET | Freq: Every day | ORAL | 5 refills | Status: DC

## 2018-07-03 NOTE — Assessment & Plan Note (Signed)
Patient with hypertension, appearing to have more diastolic issues.  Although his average was 82, only 1 of the 15 readings was actually < 80.  Also concerns about his heart rate consistently over 100 on home readings.  Although he is hopeful of stopping metoprolol completely, I am going to increase this back to 25 mg daily and decrease the olmesartan from 40 mg to 20 mg daily.  Hopefully this will let his pressure come back up 7-34 point systolic, while slowing his heart rate and hopefully lowering his diastolic pressure.  Patient is due to see Dr. Gwenlyn Found in about a month.  He was asked to check HR twice daily, continue with once daily BP readings and bring all this information, as well as his home meter to that appointment.

## 2018-07-03 NOTE — Patient Instructions (Addendum)
Return for a a follow up appointment with Dr. Gwenlyn Found on October 1  Your blood pressure today is 118/88 - our goal is to get the bottom number (diastolic) below 80 consistently  Check your blood pressure at home daily and keep record of the readings.  Take your BP meds as follows:  Decrease olmesartan from 40 mg to 20 mg daily  Increase metoprolol from 12.5 mg to 25 mg daily  Bring all of your meds, your BP cuff and your record of home blood pressures to your next appointment.  Exercise as you're able, try to walk approximately 30 minutes per day.  Keep salt intake to a minimum, especially watch canned and prepared boxed foods.  Eat more fresh fruits and vegetables and fewer canned items.  Avoid eating in fast food restaurants.    HOW TO TAKE YOUR BLOOD PRESSURE: . Rest 5 minutes before taking your blood pressure. .  Don't smoke or drink caffeinated beverages for at least 30 minutes before. . Take your blood pressure before (not after) you eat. . Sit comfortably with your back supported and both feet on the floor (don't cross your legs). . Elevate your arm to heart level on a table or a desk. . Use the proper sized cuff. It should fit smoothly and snugly around your bare upper arm. There should be enough room to slip a fingertip under the cuff. The bottom edge of the cuff should be 1 inch above the crease of the elbow. . Ideally, take 3 measurements at one sitting and record the average.
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
## 2018-08-02 ENCOUNTER — Other Ambulatory Visit: Payer: Self-pay | Admitting: Cardiovascular Disease

## 2018-08-05 ENCOUNTER — Ambulatory Visit (INDEPENDENT_AMBULATORY_CARE_PROVIDER_SITE_OTHER): Payer: 59 | Admitting: Cardiovascular Disease

## 2018-08-05 ENCOUNTER — Encounter: Payer: Self-pay | Admitting: Cardiovascular Disease

## 2018-08-05 DIAGNOSIS — E7800 Pure hypercholesterolemia, unspecified: Secondary | ICD-10-CM | POA: Diagnosis not present

## 2018-08-05 DIAGNOSIS — E785 Hyperlipidemia, unspecified: Secondary | ICD-10-CM | POA: Insufficient documentation

## 2018-08-05 DIAGNOSIS — I10 Essential (primary) hypertension: Secondary | ICD-10-CM | POA: Diagnosis not present

## 2018-08-05 MED ORDER — AMLODIPINE BESYLATE 10 MG PO TABS: 10.0000 mg | ORAL_TABLET | Freq: Every day | ORAL | 3 refills | Status: DC

## 2018-08-05 NOTE — Progress Notes (Signed)
08/05/2018 Lonnie Jackson   [birth year]  [id number]  Primary Physician Shon Baton, MD Primary Cardiologist: Lorretta Harp MD FACP, [place], [place], [place]  HPI:  Lonnie Jackson is a 62 y.o.  moderately overweight married Caucasian male father of 2, grandfather 3 grandchildren who I last saw in the office 04/25/2018.  He works for [employer].  He was referred by Dr. Virgina Jock for hypertension.  His cardiac risk factors include over 100-pack-year history tobacco abuse having quit 9 years ago.  He stopped drinking 40 years ago as well.  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  He had hypertension for the last 14 years since 2005.  He is on 3 antihypertensive medications.  2D echo performed 07/24/2017 revealed normal LV systolic function with moderate LVH.  He denies chest pain or shortness of breath.  Never had a heart attack or stroke. Since I saw him 3 months ago he has had his blood pressure medicines titrated by our hypertension clinic.  He says that he feels excessively fatigued on a beta-blocker and wishes to discontinue this.  A 2D echo performed 07/24/2017 was entirely normal.  Current Meds  Medication Sig  . amLODipine (NORVASC) 5 MG tablet TAKE 1 TABLET BY MOUTH EVERY DAY  . levothyroxine (SYNTHROID, LEVOTHROID) 50 MCG tablet Take 50 mcg by mouth daily before breakfast.   . metoprolol succinate (TOPROL-XL) 25 MG 24 hr tablet Take 25 mg by mouth daily.  . naproxen (NAPROSYN) 500 MG tablet Take 1 tablet (500 mg total) by mouth every 12 (twelve) hours as needed for mild pain or moderate pain.  Marland Kitchen olmesartan (BENICAR) 20 MG tablet Take 1 tablet (20 mg total) by mouth daily.     No Known Allergies  Social History   Socioeconomic History  . Marital status: Married    Spouse name: Not on file  . Number of children: Not on file  . Years of education: Not on file  . Highest education level: Not on file  Occupational History  . Not on file  Social Needs  . Financial resource  strain: Not on file  . Food insecurity:    Worry: Not on file    Inability: Not on file  . Transportation needs:    Medical: Not on file    Non-medical: Not on file  Tobacco Use  . Smoking status: Former Smoker    Years: 40.00    Types: Cigarettes    Last attempt to quit: 09/25/2009    Years since quitting: 8.8  . Smokeless tobacco: Never Used  Substance and Sexual Activity  . Alcohol use: No  . Drug use: No  . Sexual activity: Not on file  Lifestyle  . Physical activity:    Days per week: Not on file    Minutes per session: Not on file  . Stress: Not on file  Relationships  . Social connections:    Talks on phone: Not on file    Gets together: Not on file    Attends religious service: Not on file    Active member of club or organization: Not on file    Attends meetings of clubs or organizations: Not on file    Relationship status: Not on file  . Intimate partner violence:    Fear of current or ex partner: Not on file    Emotionally abused: Not on file    Physically abused: Not on file    Forced sexual activity: Not  on file  Other Topics Concern  . Not on file  Social History Narrative  . Not on file     Review of Systems: General: negative for chills, fever, night sweats or weight changes.  Cardiovascular: negative for chest pain, dyspnea on exertion, edema, orthopnea, palpitations, paroxysmal nocturnal dyspnea or shortness of breath Dermatological: negative for rash Respiratory: negative for cough or wheezing Urologic: negative for hematuria Abdominal: negative for nausea, vomiting, diarrhea, bright red blood per rectum, melena, or hematemesis Neurologic: negative for visual changes, syncope, or dizziness All other systems reviewed and are otherwise negative except as noted above.    Blood pressure 120/78, pulse 72, height 5\' 10"  (1.778 m), weight 222 lb 3.2 oz (100.8 kg), SpO2 96 %.  General appearance: alert and no distress Neck: no adenopathy, no carotid  bruit, no JVD, supple, symmetrical, trachea midline and thyroid not enlarged, symmetric, no tenderness/mass/nodules Lungs: clear to auscultation bilaterally Heart: regular rate and rhythm, S1, S2 normal, no murmur, click, rub or gallop Extremities: extremities normal, atraumatic, no cyanosis or edema Pulses: 2+ and symmetric Skin: Skin color, texture, turgor normal. No rashes or lesions Neurologic: Alert and oriented X 3, normal strength and tone. Normal symmetric reflexes. Normal coordination and gait  EKG not performed today  ASSESSMENT AND PLAN:   Essential hypertension History of essential hypertension currently on amlodipine, metoprolol and Benicar.  He has seen our pharmacist for medication titration and has expressed concern about his beta-blocker with side effects including fatigue.  I have reviewed his blood pressure readings and for the most part they are within normal limits.  I am going to wean him off his metoprolol and increase his amlodipine.  Hyperlipidemia She of hyperlipidemia on rosuvastatin with recent lipid profile form 04/22/2018 revealing total cholesterol 194, LDL 148 and HDL of 25 followed by his PCP.      Lorretta Harp MD FACP,FACC,FAHA, [facility] 08/05/2018 4:27 PM

## 2018-08-05 NOTE — Assessment & Plan Note (Signed)
History of essential hypertension currently on amlodipine, metoprolol and Benicar.  He has seen our pharmacist for medication titration and has expressed concern about his beta-blocker with side effects including fatigue.  I have reviewed his blood pressure readings and for the most part they are within normal limits.  I am going to wean him off his metoprolol and increase his amlodipine.

## 2018-08-05 NOTE — Patient Instructions (Signed)
Medication Instructions:  Your physician has recommended you make the following change in your medication:  1) INCREASE Norvasc to 10 mg tablet by  Mouth ONCE daily  2) DECREASE Toprol to EVERY OTHER DAY for ONE week, then STOP.   Labwork: none  Testing/Procedures: none  Follow-Up: Your physician recommends that you schedule a follow-up appointment in: 1 month with [facility]  Your physician wants you to follow-up in: 6 months with Dr. Gwenlyn Found. You will receive a reminder letter in the mail two months in advance. If you don't receive a letter, please call our office to schedule the follow-up appointment.    Any Other Special Instructions Will Be Listed Below (If Applicable).     If you need a refill on your cardiac medications before your next appointment, please call your pharmacy.

## 2018-08-05 NOTE — Assessment & Plan Note (Signed)
She of hyperlipidemia on rosuvastatin with recent lipid profile form 04/22/2018 revealing total cholesterol 194, LDL 148 and HDL of 25 followed by his PCP.

## 2018-08-06 ENCOUNTER — Encounter: Payer: Self-pay | Admitting: Cardiovascular Disease

## 2018-08-27 ENCOUNTER — Telehealth: Payer: Self-pay | Admitting: Cardiovascular Disease

## 2018-08-27 NOTE — Telephone Encounter (Signed)
Called patient back about his message. Patient complaining of swelling in his feet and legs for two weeks that comes down during the night. Patient stated his BP has been 121/79 and 130/90. Encouraged patient to keep a low salt diet and elevate his legs when sitting. At last office visit 08/05/18, patient's amlodipine was increased to 10 mg and metoprolol was discontinued after a week. Will forward to Dr. Gwenlyn Found for advisement.

## 2018-08-27 NOTE — Telephone Encounter (Signed)
New Message:   Patient calling having some concerns regarding swelling of legs and feet when he is moving around. When he is resting they go down little. May need to see Dr. Gwenlyn Found sooner. Patient stated he was going to the gym. May leave a vm

## 2018-08-28 NOTE — Telephone Encounter (Signed)
Have him come in to see an APP next week to eval

## 2018-08-29 NOTE — Telephone Encounter (Signed)
Spoke with pt, he reports the swelling goes completely away over night. He denies any SOB and reports his bp is great. He has an appointment in the HTN clinic 09-09-18 and is going to wait and come to that appointment. He will call if anything changes. Pt agreed with this plan.

## 2018-09-04 DIAGNOSIS — K7460 Unspecified cirrhosis of liver: Secondary | ICD-10-CM | POA: Diagnosis not present

## 2018-09-09 ENCOUNTER — Ambulatory Visit (INDEPENDENT_AMBULATORY_CARE_PROVIDER_SITE_OTHER): Payer: 59 | Admitting: Pharmacist Clinician (PhC)/ Clinical Pharmacy Specialist

## 2018-09-09 VITALS — BP 138/90 | HR 60 | Ht 70.0 in | Wt 232.4 lb

## 2018-09-09 DIAGNOSIS — I10 Essential (primary) hypertension: Secondary | ICD-10-CM | POA: Diagnosis not present

## 2018-09-09 IMAGING — US US ABDOMEN COMPLETE
1 series · 13 of 25 positions shown · non-contrast
Comparison: Body CT 08/30/2016 abdominal ultrasound 05/02/2006

CLINICAL DATA: History of hepatitis.  HCC screening.

EXAM:
ABDOMEN ULTRASOUND COMPLETE

[Series 1: us abdomen complete · 0.25mm/px · 13 of 95 slices shown]
[im 1/95]
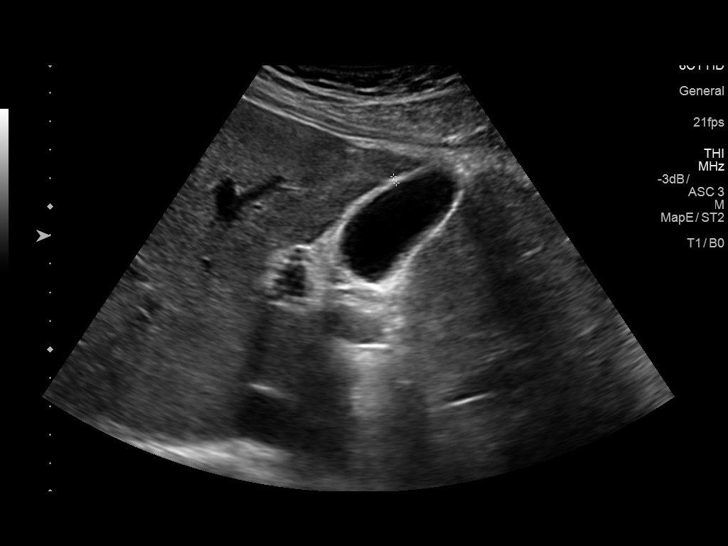
[im 8/95]
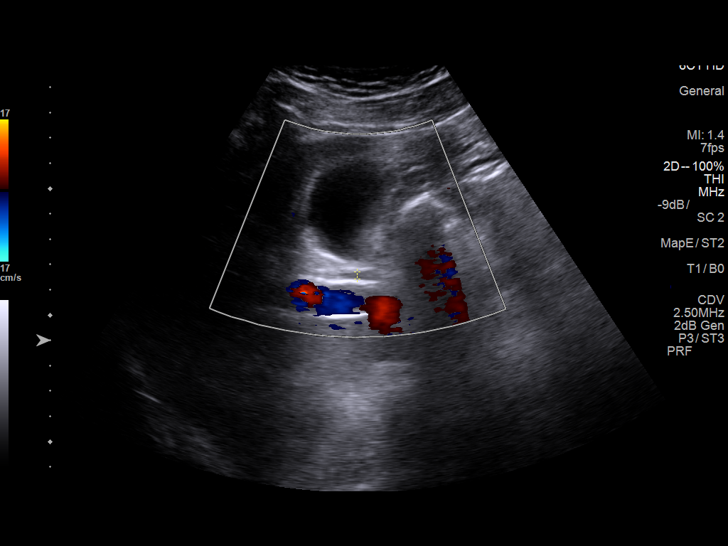
[im 16/95]
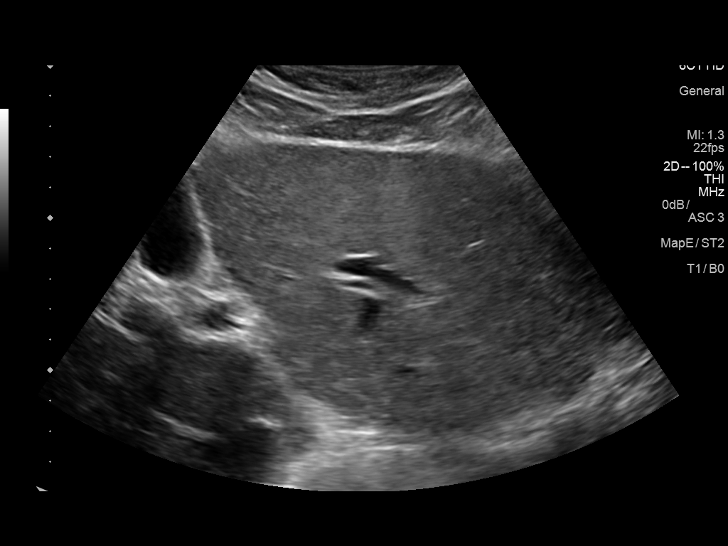
[im 24/95]
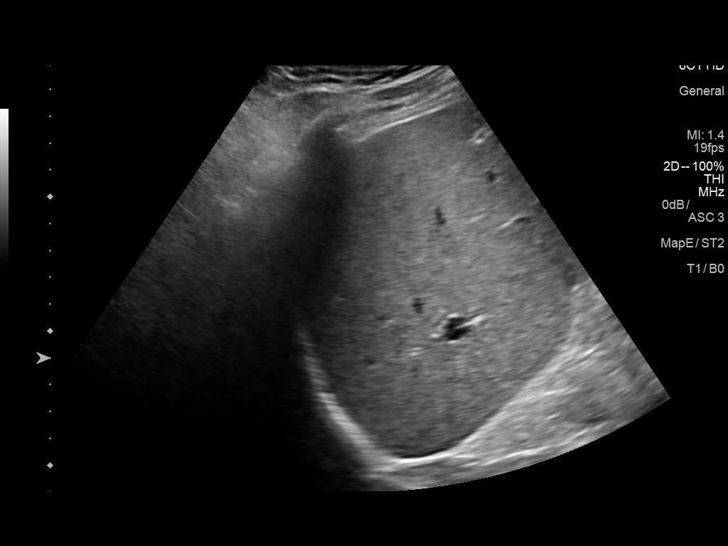
[im 32/95]
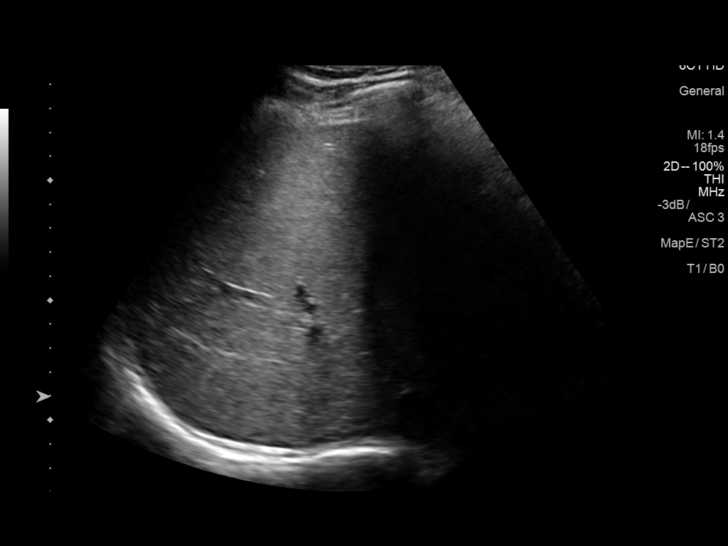
[im 40/95]
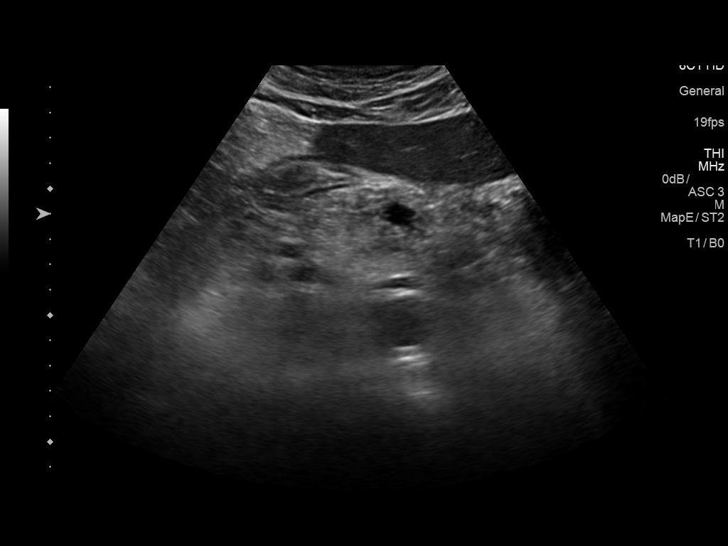
[im 48/95]
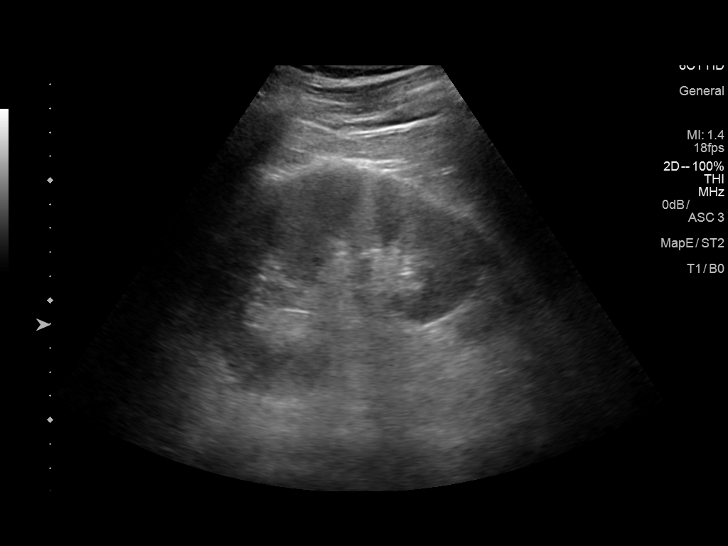
[im 55/95]
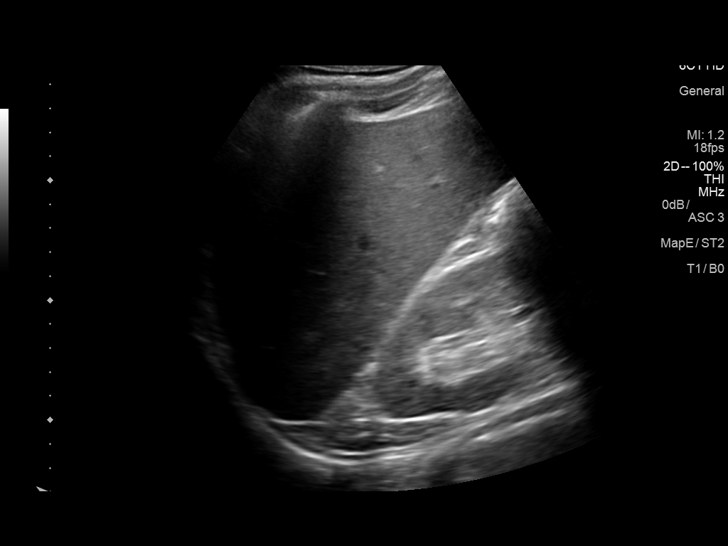
[im 63/95]
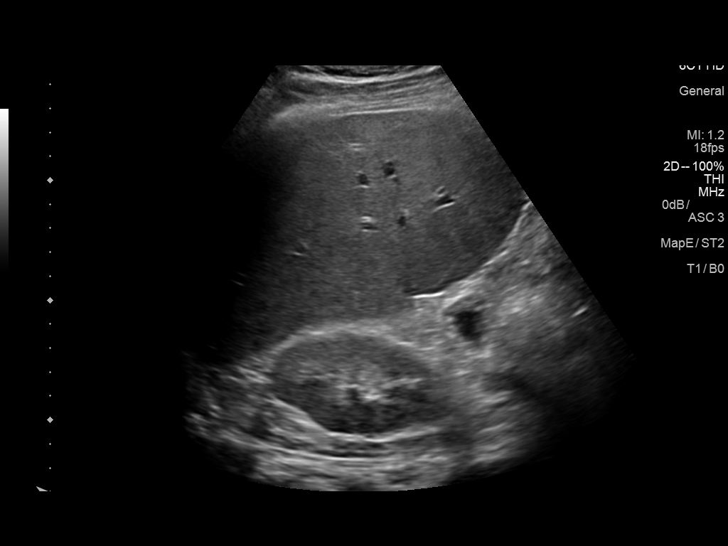
[im 71/95]
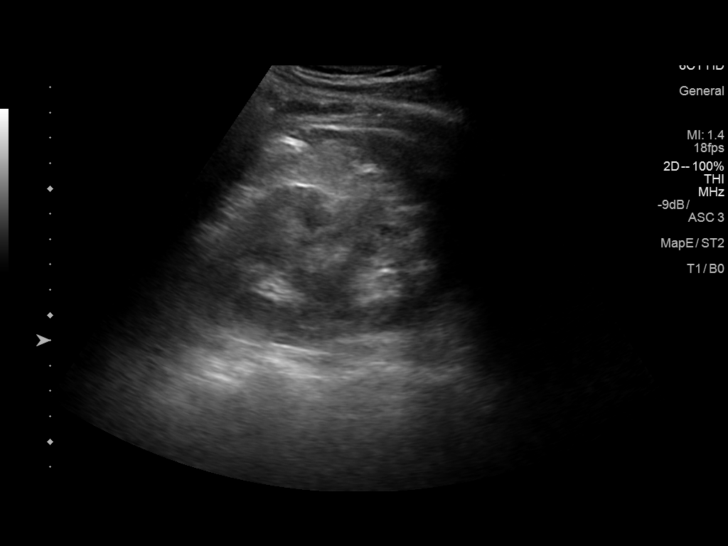
[im 79/95]
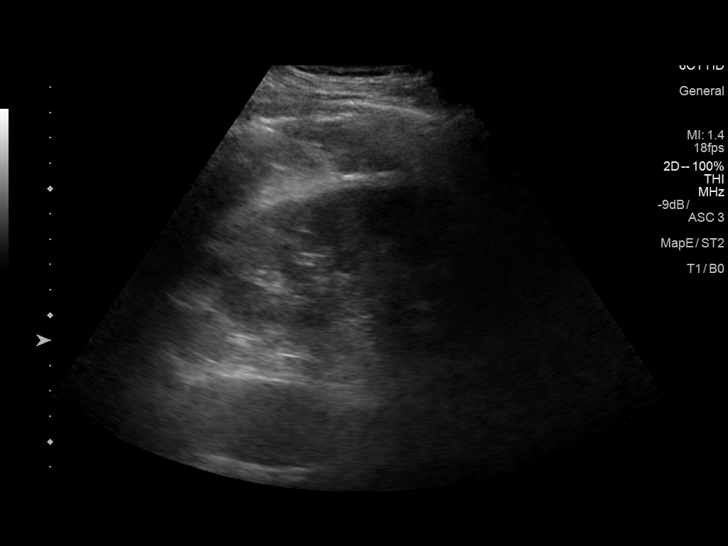
[im 87/95]
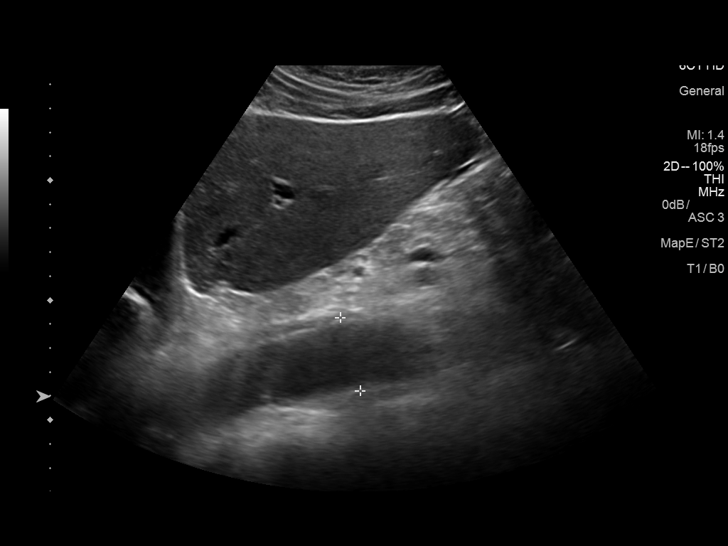
[im 95/95]
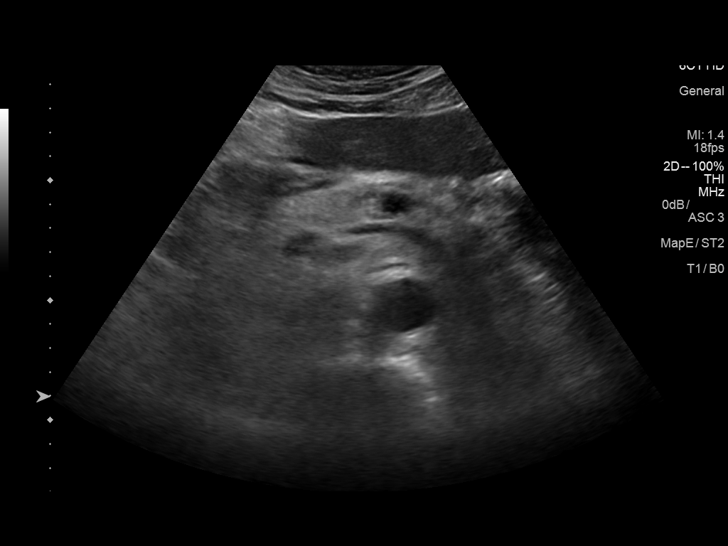

[13 of 25 positions shown; findings below may reference images not displayed]

FINDINGS: Gallbladder: No gallstones or wall thickening visualized. No
sonographic Murphy sign noted by sonographer.

Common bile duct: Diameter: 3.2 mm

Liver: No focal lesion identified. Diffusely increased parenchymal
echogenicity and coarse echotexture. Portal vein is patent on color
Doppler imaging with normal direction of blood flow towards the
liver.

IVC: No abnormality visualized.

Pancreas: Visualized portion unremarkable.

Spleen: Size and appearance within normal limits.

Right Kidney: Length: 14.1 cm. Echogenicity within normal limits. No
solid mass or hydronephrosis visualized. There is a 1 cm simple
appearing cyst in the lower pole of the right kidney, stable from
6553 and therefore considered benign.

Left Kidney: Length: 14.2 cm. Echogenicity within normal limits. No
mass or hydronephrosis visualized.

Abdominal aorta: No aneurysm visualized.

Other findings: None.
IMPRESSION: Diffusely increased parenchymal echogenicity and mildly coarse
echotexture of the liver, usually associated with hepatic fibrosis.

## 2018-09-09 MED ORDER — OLMESARTAN MEDOXOMIL 40 MG PO TABS: 40.0000 mg | ORAL_TABLET | Freq: Every day | ORAL | 3 refills | Status: DC

## 2018-09-09 NOTE — Patient Instructions (Signed)
Return for a a follow up appointment in   We will do labs in 2-3 weeks to check kidney function  Your blood pressure today is 138/90   Check your blood pressure at home daily and keep record of the readings.  Take your BP meds as follows:  Increase olmesartan to 40 mg once daily  Bring all of your meds, your BP cuff and your record of home blood pressures to your next appointment.  Exercise as you're able, try to walk approximately 30 minutes per day.  Keep salt intake to a minimum, especially watch canned and prepared boxed foods.  Eat more fresh fruits and vegetables and fewer canned items.  Avoid eating in fast food restaurants.    HOW TO TAKE YOUR BLOOD PRESSURE: . Rest 5 minutes before taking your blood pressure. .  Don't smoke or drink caffeinated beverages for at least 30 minutes before. . Take your blood pressure before (not after) you eat. . Sit comfortably with your back supported and both feet on the floor (don't cross your legs). . Elevate your arm to heart level on a table or a desk. . Use the proper sized cuff. It should fit smoothly and snugly around your bare upper arm. There should be enough room to slip a fingertip under the cuff. The bottom edge of the cuff should be 1 inch above the crease of the elbow. . Ideally, take 3 measurements at one sitting and record the average.
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
## 2018-09-11 ENCOUNTER — Encounter: Payer: Self-pay | Admitting: Pharmacist Clinician (PhC)/ Clinical Pharmacy Specialist

## 2018-09-11 NOTE — Assessment & Plan Note (Signed)
Patient with essential hypertension, but also having a few hypotensive episodes at times.   Will have him discontinue amlodipine, as it is most likely the culprit for his lower extremity edema and weight gain.  He is to monitor weight closely for then next week or two.  Will increase the olmesartan to 40 mg once daily.  He should continue with regular home BP checks as well, and we will see him in 2-3 weeks to be sure there is improvement.

## 2018-09-23 ENCOUNTER — Telehealth: Payer: Self-pay | Admitting: Cardiovascular Disease

## 2018-09-23 MED ORDER — CHLORTHALIDONE 25 MG PO TABS: 25.0000 mg | ORAL_TABLET | Freq: Every day | ORAL | 3 refills | Status: DC

## 2018-09-23 NOTE — Telephone Encounter (Signed)
Returned call to patient.He stated his B/P has been elevated for the past 1 week.Ranging 170 to 160 / 100.Pulse 70 to 100.No swelling in feet since Amlodipine was stopped.Advised I will send message to our pharmacist for advice.

## 2018-09-23 NOTE — Telephone Encounter (Signed)
Spoke with patient - he was seen in [facility] couple of weeks ago.  At that time amlodipine was causing swelling, so d/c that and increased olmesartan to 40 mg daily.  Will now add chlorthalidone 12.5mg  daily and he should keep appt next Tuesday

## 2018-09-23 NOTE — Telephone Encounter (Signed)
° ° °  Pt c/o BP issue: STAT if pt c/o blurred vision, one-sided weakness or slurred speech  1. What are your last 5 BP readings? 170/112  2. Are you having any other symptoms (ex. Dizziness, headache, blurred vision, passed out)? heachache  3. What is your BP issue? Feels BP too high

## 2018-09-25 ENCOUNTER — Telehealth: Payer: Self-pay | Admitting: Cardiovascular Disease

## 2018-09-25 NOTE — Telephone Encounter (Signed)
Spoke with patient and he is very concerned with blood pressure readings showing no improvement. Discussed with Alena Bills D and ok to increase Chlorthalidone to 25 mg daily,currnetly taking 12.5 mg daily. Keep appointment next week and bring blood pressure machine to appointment. Advised patient, verbalized understanding.

## 2018-09-25 NOTE — Telephone Encounter (Signed)
New Message   Pt c/o BP issue: STAT if pt c/o blurred vision, one-sided weakness or slurred speech  1. What are your last 5 BP readings? 187/118 and 172/112  2. Are you having any other symptoms (ex. Dizziness, headache, blurred vision, passed out)? Having headaches prior to starting dieretic   3. What is your BP issue? Pt states he is concerned about his blood pressures because he has a family history of stroke. Please call

## 2018-09-30 ENCOUNTER — Ambulatory Visit (INDEPENDENT_AMBULATORY_CARE_PROVIDER_SITE_OTHER): Payer: 59 | Admitting: Pharmacist Clinician (PhC)/ Clinical Pharmacy Specialist

## 2018-09-30 DIAGNOSIS — I10 Essential (primary) hypertension: Secondary | ICD-10-CM | POA: Diagnosis not present
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
## 2018-09-30 NOTE — Assessment & Plan Note (Signed)
Patient with hypertension, now mostly diastolic concerns.  He has only been on the chlorthalidone for 2-3 days, so we will give it some time to determine the full effectiveness.  He will need to get a BMET in another week and we will see him back in the office in 3 weeks for follow up.   He was advised to call [employer] regarding the inaccuracy of his home meter, or if he buys another, to keep the receipt until we can verify accuracy.

## 2018-09-30 NOTE — Patient Instructions (Addendum)
Return for a a follow up appointment in 2-3 weeks  Go to the lab in 4-78 days for metabolic panel  Your blood pressure today is 132/100   Don't use your home BP machine, it is reading 30 points higher on the systolic reading (top number)  Take your BP meds as follows:  Continue with current medications - take one of them in the morning and the other at night  Bring all of your meds, your BP cuff and your record of home blood pressures to your next appointment.  Exercise as you're able, try to walk approximately 30 minutes per day.  Keep salt intake to a minimum, especially watch canned and prepared boxed foods.  Eat more fresh fruits and vegetables and fewer canned items.  Avoid eating in fast food restaurants.    HOW TO TAKE YOUR BLOOD PRESSURE: . Rest 5 minutes before taking your blood pressure. .  Don't smoke or drink caffeinated beverages for at least 30 minutes before. . Take your blood pressure before (not after) you eat. . Sit comfortably with your back supported and both feet on the floor (don't cross your legs). . Elevate your arm to heart level on a table or a desk. . Use the proper sized cuff. It should fit smoothly and snugly around your bare upper arm. There should be enough room to slip a fingertip under the cuff. The bottom edge of the cuff should be 1 inch above the crease of the elbow. . Ideally, take 3 measurements at one sitting and record the average.

## 2018-10-09 DIAGNOSIS — I10 Essential (primary) hypertension: Secondary | ICD-10-CM | POA: Diagnosis not present

## 2018-10-10 LAB — BASIC METABOLIC PANEL
BUN / CREAT RATIO: 22 (ref 10–24)
BUN: 32 mg/dL — AB (ref 8–27)
CALCIUM: 9.8 mg/dL (ref 8.6–10.2)
CHLORIDE: 97 mmol/L (ref 96–106)
CO2: 27 mmol/L (ref 20–29)
CREATININE: 1.45 mg/dL — AB (ref 0.76–1.27)
GFR, EST AFRICAN AMERICAN: 60 mL/min/{1.73_m2} (ref >59)
GFR, EST NON AFRICAN AMERICAN: 52 mL/min/{1.73_m2} — AB (ref >59)
Sodium: 141 mmol/L (ref 134–144)

## 2018-10-14 ENCOUNTER — Telehealth: Payer: Self-pay

## 2018-10-14 NOTE — Telephone Encounter (Signed)
Spoke with pt regarding coming in for a lipid panel for his appt on Thursday and the pt was compliant

## 2018-10-15 ENCOUNTER — Other Ambulatory Visit: Payer: Self-pay | Admitting: *Deleted

## 2018-10-15 DIAGNOSIS — E7800 Pure hypercholesterolemia, unspecified: Secondary | ICD-10-CM

## 2018-10-16 ENCOUNTER — Ambulatory Visit (INDEPENDENT_AMBULATORY_CARE_PROVIDER_SITE_OTHER): Payer: 59 | Admitting: Pharmacist Clinician (PhC)/ Clinical Pharmacy Specialist

## 2018-10-16 VITALS — BP 126/92 | HR 86

## 2018-10-16 DIAGNOSIS — I10 Essential (primary) hypertension: Secondary | ICD-10-CM | POA: Diagnosis not present

## 2018-10-16 LAB — LIPID PANEL
Chol/HDL Ratio: 4.3 ratio (ref 0.0–5.0)
Cholesterol, Total: 128 mg/dL (ref 100–199)
HDL: 30 mg/dL — ABNORMAL LOW (ref >39)
LDL Calculated: 57 mg/dL (ref 0–99)
Triglycerides: 204 mg/dL — ABNORMAL HIGH (ref 0–149)
VLDL Cholesterol Cal: 41 mg/dL — ABNORMAL HIGH (ref 5–40)

## 2018-10-16 NOTE — Patient Instructions (Addendum)
Return for a a follow up appointment in January  Go to the lab today  Your blood pressure today is 126/92  Check your blood pressure at home daily and keep record of the readings.  Take your BP meds as follows:  Stop taking the chlorthalidone.  Continue with the olmesartan 40 mg dialy  Work on lifestyle changes - continue to eliminate highly salty foods from your diet and increase your cardio workouts as tolerated.  Bring all of your meds, your BP cuff and your record of home blood pressures to your next appointment.  Exercise as you're able, try to walk approximately 30 minutes per day.  Keep salt intake to a minimum, especially watch canned and prepared boxed foods.  Eat more fresh fruits and vegetables and fewer canned items.  Avoid eating in fast food restaurants.    HOW TO TAKE YOUR BLOOD PRESSURE: . Rest 5 minutes before taking your blood pressure. .  Don't smoke or drink caffeinated beverages for at least 30 minutes before. . Take your blood pressure before (not after) you eat. . Sit comfortably with your back supported and both feet on the floor (don't cross your legs). . Elevate your arm to heart level on a table or a desk. . Use the proper sized cuff. It should fit smoothly and snugly around your bare upper arm. There should be enough room to slip a fingertip under the cuff. The bottom edge of the cuff should be 1 inch above the crease of the elbow. . Ideally, take 3 measurements at one sitting and record the average.
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
## 2018-10-17 LAB — BASIC METABOLIC PANEL
BUN/Creatinine Ratio: 17 (ref 10–24)
BUN: 21 mg/dL (ref 8–27)
CO2: 25 mmol/L (ref 20–29)
Calcium: 9.8 mg/dL (ref 8.6–10.2)
Chloride: 102 mmol/L (ref 96–106)
Creatinine, Ser: 1.26 mg/dL (ref 0.76–1.27)
GFR calc Af Amer: 71 mL/min/{1.73_m2} (ref >59)
GFR, EST NON AFRICAN AMERICAN: 61 mL/min/{1.73_m2} (ref >59)
Glucose: 86 mg/dL (ref 65–99)
Potassium: 4.3 mmol/L (ref 3.5–5.2)
Sodium: 144 mmol/L (ref 134–144)

## 2018-10-20 ENCOUNTER — Encounter: Payer: Self-pay | Admitting: *Deleted

## 2018-10-22 ENCOUNTER — Encounter: Payer: Self-pay | Admitting: Pharmacist Clinician (PhC)/ Clinical Pharmacy Specialist

## 2018-10-22 NOTE — Assessment & Plan Note (Signed)
Patient doing better, although diastolic still higher than I would like.  He will discontinue the chlorthalidone, and instead make a better effort at eating a low salt, low processed food diet.  He is encouraged to increase exercise as tolerated, but avoid excessive weight lifting.  We will follow up with him in late January.

## 2018-12-09 ENCOUNTER — Ambulatory Visit (INDEPENDENT_AMBULATORY_CARE_PROVIDER_SITE_OTHER): Payer: 59 | Admitting: Pharmacist Clinician (PhC)/ Clinical Pharmacy Specialist

## 2018-12-09 DIAGNOSIS — I10 Essential (primary) hypertension: Secondary | ICD-10-CM | POA: Diagnosis not present

## 2018-12-09 NOTE — Patient Instructions (Signed)
  Your blood pressure today is 136/88  Check your blood pressure at home several times each weekgf and keep record of the readings.  Take your BP meds as follows:  Continue with Olmesartan daily  Bring all of your meds, your BP cuff and your record of home blood pressures to your next appointment.  Exercise as you're able, try to walk approximately 30 minutes per day.  Keep salt intake to a minimum, especially watch canned and prepared boxed foods.  Eat more fresh fruits and vegetables and fewer canned items.  Avoid eating in fast food restaurants.    HOW TO TAKE YOUR BLOOD PRESSURE: . Rest 5 minutes before taking your blood pressure. .  Don't smoke or drink caffeinated beverages for at least 30 minutes before. . Take your blood pressure before (not after) you eat. . Sit comfortably with your back supported and both feet on the floor (don't cross your legs). . Elevate your arm to heart level on a table or a desk. . Use the proper sized cuff. It should fit smoothly and snugly around your bare upper arm. There should be enough room to slip a fingertip under the cuff. The bottom edge of the cuff should be 1 inch above the crease of the elbow. . Ideally, take 3 measurements at one sitting and record the average.
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
## 2018-12-09 NOTE — Assessment & Plan Note (Signed)
Patient with diastolic hypertension, now with most readings consistently < 90.  He will continue with the olmesartan, regular exercise and monitoring his diet.  He is scheduled to see Dr. Gwenlyn Found in March and knows that we can see him if his home BP readings increase.

## 2019-01-29 ENCOUNTER — Encounter: Payer: Self-pay | Admitting: Cardiovascular Disease

## 2019-01-29 ENCOUNTER — Telehealth: Payer: Self-pay | Admitting: *Deleted

## 2019-01-29 NOTE — Telephone Encounter (Signed)
New Message:     Pt wants to know if he still have his appt tomorrow with Dr Gwenlyn Found?

## 2019-01-29 NOTE — Telephone Encounter (Signed)
This encounter was created in error - please disregard.

## 2019-01-29 NOTE — Telephone Encounter (Signed)
   Cardiac Questionnaire:    Since your last visit or hospitalization:    1. Have you been having new or worsening chest pain? no   2. Have you been having new or worsening shortness of breath? no 3. Have you been having new or worsening leg swelling, wt gain, or increase in abdominal girth (pants fitting more tightly)? no   4. Have you had any passing out spells? no    *A YES to any of these questions would result in the appointment being kept. *If all the answers to these questions are NO, we should indicate that given the current situation regarding the worldwide coronarvirus pandemic, at the recommendation of the CDC, we are looking to limit gatherings in our waiting area, and thus will reschedule their appointment beyond four weeks from today.   _____________      Primary Cardiologist:  No primary care provider on file.   Patient contacted.  History reviewed.  No symptoms to suggest any unstable cardiac conditions.  Based on discussion, with current pandemic situation, we will be postponing this appointment for Lonnie Jackson with a plan for f/u in 3 months or sooner if feasible/necessary.  If symptoms change, he has been instructed to contact our office.   Follow up scheduled   Fredia Beets, RN  01/29/2019 2:35 PM         .

## 2019-01-30 ENCOUNTER — Ambulatory Visit: Payer: 59 | Admitting: Cardiovascular Disease

## 2019-04-02 ENCOUNTER — Telehealth: Payer: Self-pay | Admitting: Cardiovascular Disease

## 2019-04-03 ENCOUNTER — Telehealth (INDEPENDENT_AMBULATORY_CARE_PROVIDER_SITE_OTHER): Payer: 59 | Admitting: Cardiovascular Disease

## 2019-04-03 ENCOUNTER — Encounter: Payer: Self-pay | Admitting: Cardiovascular Disease

## 2019-04-03 ENCOUNTER — Telehealth: Payer: Self-pay

## 2019-04-03 DIAGNOSIS — E782 Mixed hyperlipidemia: Secondary | ICD-10-CM

## 2019-04-03 DIAGNOSIS — I10 Essential (primary) hypertension: Secondary | ICD-10-CM | POA: Diagnosis not present
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
## 2019-04-03 NOTE — Telephone Encounter (Signed)
Contacted pt to review AVS. Left message stating AVS would be sent via mail and lmtcb.  Letter including After Visit Summary and any other necessary documents to be mailed to the patient's address on file.

## 2019-04-03 NOTE — Patient Instructions (Signed)

## 2019-04-07 ENCOUNTER — Ambulatory Visit: Payer: 59 | Admitting: Cardiovascular Disease

## 2019-04-30 NOTE — Telephone Encounter (Signed)
Open n error °

## 2019-09-24 ENCOUNTER — Other Ambulatory Visit: Payer: Self-pay

## 2019-09-24 DIAGNOSIS — Z20822 Contact with and (suspected) exposure to covid-19: Secondary | ICD-10-CM

## 2019-09-27 LAB — NOVEL CORONAVIRUS, NAA: SARS-CoV-2, NAA: NOT DETECTED

## 2019-10-08 ENCOUNTER — Other Ambulatory Visit: Payer: Self-pay | Admitting: Cardiovascular Disease

## 2019-10-08 MED ORDER — OLMESARTAN MEDOXOMIL 40 MG PO TABS: 40.0000 mg | ORAL_TABLET | Freq: Every day | ORAL | 0 refills | Status: DC

## 2019-10-08 NOTE — Telephone Encounter (Signed)
°*  STAT* If patient is at the pharmacy, call can be transferred to refill team.   1. Which medications need to be refilled? (please list name of each medication and dose if known) Olmesartan 40 mg  2. Which pharmacy/location (including street and city if local pharmacy) is medication to be sent to? [facility] [address]  3. Do they need a 30 day or 90 day supply? 90 would be fine

## 2019-10-08 NOTE — Telephone Encounter (Signed)
Requested Prescriptions   Signed Prescriptions Disp Refills  . olmesartan (BENICAR) 40 MG tablet 90 tablet 0    Sig: Take 1 tablet (40 mg total) by mouth daily.    Authorizing Provider: Lorretta Harp    Ordering User: Raelene Bott, BRANDY L

## 2019-10-15 ENCOUNTER — Other Ambulatory Visit: Payer: Self-pay

## 2019-10-15 ENCOUNTER — Encounter: Payer: Self-pay | Admitting: Cardiovascular Disease

## 2019-10-15 ENCOUNTER — Ambulatory Visit: Payer: 59 | Admitting: Cardiovascular Disease

## 2019-10-15 DIAGNOSIS — I10 Essential (primary) hypertension: Secondary | ICD-10-CM | POA: Diagnosis not present

## 2019-10-15 NOTE — Assessment & Plan Note (Signed)
History of hyperlipidemia on Crestor with lipid profile performed 10/15/2018 revealing total cholesterol 128, LDL 57 HDL of 30.

## 2019-10-15 NOTE — Patient Instructions (Signed)
Medication Instructions:  Your physician recommends that you continue on your current medications as directed. Please refer to the Current Medication list given to you today.  If you need a refill on your cardiac medications before your next appointment, please call your pharmacy.   Lab work: NONE  Testing/Procedures: NONE  Follow-Up: At [facility], you and your health needs are our priority.  As part of our continuing mission to provide you with exceptional heart care, we have created designated Provider Care Teams.  These Care Teams include your primary Cardiologist (physician) and Advanced Practice Providers (APPs -  Physician Assistants and Nurse Practitioners) who all work together to provide you with the care you need, when you need it. You may see Dr Berry or one of the following Advanced Practice Providers on your designated Care Team:    Luke Kilroy, PA-C  Callie Goodrich, PA-C  Jesse Cleaver, FNP Your physician wants you to follow-up in: 1 year      

## 2019-10-15 NOTE — Progress Notes (Signed)
10/15/2019 [name]   [birth year]  [id number]  Primary Physician Shon Baton, MD Primary Cardiologist: Lorretta Harp MD FACP, [place], [place], [place]  HPI:  Lonnie Jackson is a 63 y.o.  moderately overweight married Caucasian male father of 2, grandfather 3 grandchildren whoI last spoke to him virtually for a telemedicine visit on 04/03/2019. He works for [employer]. He was referred by Dr. Virgina Jock for hypertension. His cardiac risk factors include over 100-pack-year history tobacco abuse having quit 9 years ago. He stopped drinking 40 years ago as well. 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 He had hypertension for the last 14 years since 2005. He is on 3 antihypertensive medications. 2D echo performed 07/24/2017 revealed normal LV systolic function with moderate LVH. He denies chest pain or shortness of breath. Never had a heart attack or stroke. He has been working with Cyril Mourning in our hypertension clinic for medication titration.  He says that he feels excessively fatigued on a beta-blocker and wishes to discontinue this. A 2D echo performed 07/24/2017 was entirely normal.  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  His stress level has been markedly reduced.  He walks 5 to 6 miles a day.  He is lost some weight.  His medications have been simplified now taking only olmesartan for hypertension and Crestor for hyperlipidemia.  Blood pressures under good control.  He denies chest pain or shortness of breath.  Since last spoke to him 6 months ago he is back working at [employer].  He still has no stress.  Blood pressures under good control.  He powered weight lifts frequently without symptoms.   Current Meds  Medication Sig  . levothyroxine (SYNTHROID, LEVOTHROID) 50 MCG tablet Take 50 mcg by mouth daily before breakfast.   . olmesartan (BENICAR) 40 MG tablet Take 1 tablet  (40 mg total) by mouth daily.  . rosuvastatin (CRESTOR) 10 MG tablet Take 10 mg by mouth daily.     No Known Allergies  Social History   Socioeconomic History  . Marital status: Married    Spouse name: Not on file  . Number of children: Not on file  . Years of education: Not on file  . Highest education level: Not on file  Occupational History  . Not on file  Tobacco Use  . Smoking status: Former Smoker    Years: 40.00    Types: Cigarettes    Quit date: 09/25/2009    Years since quitting: 10.0  . Smokeless tobacco: Never Used  Substance and Sexual Activity  . Alcohol use: No  . Drug use: No  . Sexual activity: Not on file  Other Topics Concern  . Not on file  Social History Narrative  . Not on file   Social Determinants of Health   Financial Resource Strain:   . Difficulty of Paying Living Expenses: Not on file  Food Insecurity:   . Worried About Charity fundraiser in the Last Year: Not on file  . Ran Out of Food in the Last Year: Not on file  Transportation Needs:   . Lack of Transportation (Medical): Not on file  . Lack of Transportation (Non-Medical): Not on file  Physical Activity:   . Days of Exercise per Week: Not on file  . Minutes of Exercise per Session: Not on file  Stress:   .  Feeling of Stress : Not on file  Social Connections:   . Frequency of Communication with Friends and Family: Not on file  . Frequency of Social Gatherings with Friends and Family: Not on file  . Attends Religious Services: Not on file  . Active Member of Clubs or Organizations: Not on file  . Attends Archivist Meetings: Not on file  . Marital Status: Not on file  Intimate Partner Violence:   . Fear of Current or Ex-Partner: Not on file  . Emotionally Abused: Not on file  . Physically Abused: Not on file  . Sexually Abused: Not on file     Review of Systems: General: negative for chills, fever, night sweats or weight changes.  Cardiovascular: negative for  chest pain, dyspnea on exertion, edema, orthopnea, palpitations, paroxysmal nocturnal dyspnea or shortness of breath Dermatological: negative for rash Respiratory: negative for cough or wheezing Urologic: negative for hematuria Abdominal: negative for nausea, vomiting, diarrhea, bright red blood per rectum, melena, or hematemesis Neurologic: negative for visual changes, syncope, or dizziness All other systems reviewed and are otherwise negative except as noted above.    Blood pressure 138/84, pulse 73, height 5\' 10"  (1.778 m), weight 220 lb 12.8 oz (100.2 kg).  General appearance: alert and no distress Neck: no adenopathy, no carotid bruit, no JVD, supple, symmetrical, trachea midline and thyroid not enlarged, symmetric, no tenderness/mass/nodules Lungs: clear to auscultation bilaterally Heart: regular rate and rhythm, S1, S2 normal, no murmur, click, rub or gallop Extremities: extremities normal, atraumatic, no cyanosis or edema Pulses: 2+ and symmetric Skin: Skin color, texture, turgor normal. No rashes or lesions Neurologic: Alert and oriented X 3, normal strength and tone. Normal symmetric reflexes. Normal coordination and gait  EKG sinus rhythm at 73 with without ST or T wave changes.  Personally reviewed this EKG.  ASSESSMENT AND PLAN:   Essential hypertension History of essential hypertension with blood pressure measured today 138/84.  He is on olmesartan.  Hyperlipidemia History of hyperlipidemia on Crestor with lipid profile performed 10/15/2018 revealing total cholesterol 128, LDL 57 HDL of 30.      Lorretta Harp MD FACP,FACC,FAHA, [facility] 10/15/2019 4:40 PM

## 2019-10-15 NOTE — Assessment & Plan Note (Signed)
History of essential hypertension with blood pressure measured today 138/84.  He is on olmesartan.

## 2020-03-07 ENCOUNTER — Telehealth: Payer: Self-pay | Admitting: Cardiovascular Disease

## 2020-03-07 NOTE — Telephone Encounter (Signed)
Received call from Lonnie Jackson at [facility]-states they saw patient today for routine eye exam and noticed a blockage in his right eye (BRVO).  They checked his blood pressure and it was elevated 162/118 and 174/110.  Patient asymptomatic.    Advised would make Dr. Gwenlyn Found aware.  Called patient-he states he is doing good.  States his blood pressure was doing so well, he stopped monitoring at home.  He states he just had exam with PCP and everything was great including BP.   He denies HA, blurred vision, dizziness, etc.  States he is completely asymptomatic and believes BP was elevated due to being anxious and frustrated at the eye doctor.  He states he will start monitoring his blood pressure again.   Advised to start logging blood pressure readings and will return call with recommendations.  Advised he will likely need OV to assess.  Patient aware.

## 2020-03-11 NOTE — Telephone Encounter (Signed)
Discussed with dr berry, no further testing or follow up needed.

## 2020-03-30 ENCOUNTER — Other Ambulatory Visit: Payer: Self-pay | Admitting: Cardiovascular Disease

## 2020-04-10 ENCOUNTER — Other Ambulatory Visit: Payer: Self-pay | Admitting: Cardiovascular Disease

## 2020-04-11 NOTE — Telephone Encounter (Signed)
Rx request sent to pharmacy.  

## 2020-10-04 ENCOUNTER — Other Ambulatory Visit: Payer: Self-pay | Admitting: Cardiovascular Disease

## 2020-11-08 DIAGNOSIS — Z1152 Encounter for screening for COVID-19: Secondary | ICD-10-CM | POA: Diagnosis not present

## 2020-11-09 DIAGNOSIS — Z1152 Encounter for screening for COVID-19: Secondary | ICD-10-CM | POA: Diagnosis not present

## 2020-11-25 DIAGNOSIS — Z0000 Encounter for general adult medical examination without abnormal findings: Secondary | ICD-10-CM | POA: Diagnosis not present

## 2020-11-25 DIAGNOSIS — I10 Essential (primary) hypertension: Secondary | ICD-10-CM | POA: Diagnosis not present

## 2021-03-01 ENCOUNTER — Ambulatory Visit: Payer: 59 | Admitting: Cardiovascular Disease

## 2021-04-08 ENCOUNTER — Other Ambulatory Visit: Payer: Self-pay | Admitting: Cardiovascular Disease

## 2021-05-08 ENCOUNTER — Other Ambulatory Visit: Payer: Self-pay | Admitting: Cardiovascular Disease

## 2021-05-10 ENCOUNTER — Ambulatory Visit: Payer: Self-pay | Admitting: Cardiovascular Disease

## 2021-06-05 ENCOUNTER — Other Ambulatory Visit: Payer: Self-pay | Admitting: Cardiovascular Disease

## 2021-08-10 ENCOUNTER — Other Ambulatory Visit: Payer: Self-pay | Admitting: Cardiovascular Disease

## 2021-09-06 ENCOUNTER — Telehealth: Payer: Self-pay | Admitting: Cardiovascular Disease

## 2021-09-06 MED ORDER — OLMESARTAN MEDOXOMIL 40 MG PO TABS: 40.0000 mg | ORAL_TABLET | Freq: Every day | ORAL | 0 refills | Status: DC

## 2021-09-06 NOTE — Telephone Encounter (Signed)
Benicar 40 mg was sent into pts pharmacy per pts request. Left a message for pt. Pt is aware that RX was sent in.

## 2021-09-06 NOTE — Telephone Encounter (Signed)
*  STAT* If patient is at the pharmacy, call can be transferred to refill team.   1. Which medications need to be refilled? (please list name of each medication and dose if known)  olmesartan (BENICAR) 40 MG tablet  2. Which pharmacy/location (including street and city if local pharmacy) is medication to be sent to? [facility], [place], [place] [address]  3. Do they need a 30 day or 90 day supply?   30 day supply

## 2021-10-10 ENCOUNTER — Ambulatory Visit: Payer: Self-pay | Admitting: Cardiovascular Disease

## 2021-11-07 ENCOUNTER — Other Ambulatory Visit: Payer: Self-pay

## 2021-11-07 ENCOUNTER — Encounter: Payer: Self-pay | Admitting: Cardiovascular Disease

## 2021-11-07 ENCOUNTER — Ambulatory Visit (INDEPENDENT_AMBULATORY_CARE_PROVIDER_SITE_OTHER): Payer: Medicare Other | Admitting: Cardiovascular Disease

## 2021-11-07 VITALS — BP 140/90 | HR 62 | Ht 69.0 in | Wt 225.4 lb

## 2021-11-07 DIAGNOSIS — I10 Essential (primary) hypertension: Secondary | ICD-10-CM

## 2021-11-07 DIAGNOSIS — E782 Mixed hyperlipidemia: Secondary | ICD-10-CM

## 2021-11-07 NOTE — Progress Notes (Signed)
11/07/2021 [name]   [birth year]  [id number]  Primary Physician Shon Baton, MD Primary Cardiologist: Lorretta Harp MD FACP, [place], [place], [place]  HPI:  MASON DIBIASIO is a 66 y.o.  moderately overweight married Caucasian male father of 2, grandfather 3 grandchildren who I last saw in the office 10/15/2019.  He works for [employer].  He was referred by Dr. Virgina Jock for hypertension.  His cardiac risk factors include over 100-pack-year history tobacco abuse having quit 9 years ago.  He stopped drinking 40 years ago as well.  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  He had hypertension for the last 14 years since 2005.  He is on 3 antihypertensive medications.  2D echo performed 07/24/2017 revealed normal LV systolic function with moderate LVH.  He denies chest pain or shortness of breath.  Never had a heart attack or stroke.  He has been working with Cyril Mourning in our hypertension clinic for medication titration.  He says that he feels excessively fatigued on a beta-blocker and wishes to discontinue this.  A 2D echo performed 07/24/2017 was entirely normal.   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  His stress level has been markedly reduced.  He walks 5 to 6 miles a day.  He is lost some weight.  His medications have been simplified now taking only olmesartan for hypertension and Crestor for hyperlipidemia.    [place] saw [place] 2 [place]  He has not working out as much as he has in the past which she is disappointed about but rather focusing on his family and grandchildren.  He is taking his medications as directed.  He denies chest pain or shortness of breath.   Current Meds  Medication Sig   olmesartan (BENICAR) 40 MG tablet Take 1 tablet (40 mg total) by mouth daily.   rosuvastatin (CRESTOR) 10 MG tablet Take 10 mg by mouth daily.     No Known Allergies  Social History    Socioeconomic History   Marital status: Married    Spouse name: Not on file   Number of children: Not on file   Years of education: Not on file   Highest education level: Not on file  Occupational History   Not on file  Tobacco Use   Smoking status: Former    Years: 40.00    Types: Cigarettes    Quit date: 09/25/2009    Years since quitting: 12.1   Smokeless tobacco: Never  Vaping Use   Vaping Use: Never used  Substance and Sexual Activity   Alcohol use: No   Drug use: No   Sexual activity: Not on file  Other Topics Concern   Not on file  Social History Narrative   Not on file   Social Determinants of Health   Financial Resource Strain: Not on file  Food Insecurity: Not on file  Transportation Needs: Not on file  Physical Activity: Not on file  Stress: Not on file  Social Connections: Not on file  Intimate Partner Violence: Not on file     Review of Systems: General: negative for chills, fever, night sweats or weight changes.  Cardiovascular: negative for chest pain, dyspnea on exertion, edema, orthopnea, palpitations, paroxysmal nocturnal dyspnea or shortness of breath Dermatological: negative for rash Respiratory: negative for cough or wheezing Urologic: negative for hematuria Abdominal: negative for  nausea, vomiting, diarrhea, bright red blood per rectum, melena, or hematemesis Neurologic: negative for visual changes, syncope, or dizziness All other systems reviewed and are otherwise negative except as noted above.    Blood pressure (!) 154/122, pulse 62, height 5\' 9"  (1.753 m), weight 225 lb 6.4 oz (102.2 kg), SpO2 98 %.  General appearance: alert and no distress Neck: no adenopathy, no carotid bruit, no JVD, supple, symmetrical, trachea midline, and thyroid not enlarged, symmetric, no tenderness/mass/nodules Lungs: clear to auscultation bilaterally Heart: regular rate and rhythm, S1, S2 normal, no murmur, click, rub or gallop Extremities: extremities  normal, atraumatic, no cyanosis or edema Pulses: 2+ and symmetric  EKG sinus rhythm at 62 without ST or T wave changes.  I personally reviewed this EKG.  ASSESSMENT AND PLAN:   Essential hypertension History of essential hypertension blood pressure measured today at 154/122.  He is on olmesartan which she took this morning.  I am going to have him keep a blood pressure log for next 30 days and see Daleen Snook back to review make appropriate changes.  Hyperlipidemia History of hyperlipidemia on statin therapy with lipid profile performed 11/25/2020 revealing a total cholesterol of 105, LDL of 62 and HDL 23.  We will recheck a fasting lipid liver profile today.     Lorretta Harp MD FACP,FACC,FAHA, [facility] 11/07/2021 11:21 AM

## 2021-11-07 NOTE — Assessment & Plan Note (Signed)
History of essential hypertension blood pressure measured today at 154/122.  He is on olmesartan which she took this morning.  I am going to have him keep a blood pressure log for next 30 days and see Daleen Snook back to review make appropriate changes.

## 2021-11-07 NOTE — Patient Instructions (Signed)
Medication Instructions:  Your physician recommends that you continue on your current medications as directed. Please refer to the Current Medication list given to you today.   *If you need a refill on your cardiac medications before your next appointment, please call your pharmacy*   Lab Work: Your physician recommends that you have labs drawn today: Lipid/liver profile.   If you have labs (blood work) drawn today and your tests are completely normal, you will receive your results only by: [place] (if you have MyChart) OR A paper copy in the mail If you have any lab test that is abnormal or we need to change your treatment, we will call you to review the results.   Follow-Up: At [facility], you and your health needs are our priority.  As part of our continuing mission to provide you with exceptional heart care, we have created designated Provider Care Teams.  These Care Teams include your primary Cardiologist (physician) and Advanced Practice Providers (APPs -  Physician Assistants and Nurse Practitioners) who all work together to provide you with the care you need, when you need it.  We recommend signing up for the patient portal called "MyChart".  Sign up information is provided on this After Visit Summary.  MyChart is used to connect with patients for Virtual Visits (Telemedicine).  Patients are able to view lab/test results, encounter notes, upcoming appointments, etc.  Non-urgent messages can be sent to your provider as well.   To learn more about what you can do with MyChart, go to [email or web address].    Your next appointment:   12 month(s)  The format for your next appointment:   In Person  Provider:   Quay Burow, MD   Other Instructions Dr. Gwenlyn Found has requested that you schedule an appointment with one of our clinical pharmacists for a blood pressure check appointment within the next 4 weeks.  If you monitor your blood pressure (BP) at home, please bring  your BP cuff and your BP readings with you to this appointment  HOW TO TAKE YOUR BLOOD PRESSURE: Rest 5 minutes before taking your blood pressure. Dont smoke or drink caffeinated beverages for at least 30 minutes before. Take your blood pressure before (not after) you eat. Sit comfortably with your back supported and both feet on the floor (dont cross your legs). Elevate your arm to heart level on a table or a desk. Use the proper sized cuff. It should fit smoothly and snugly around your bare upper arm. There should be enough room to slip a fingertip under the cuff. The bottom edge of the cuff should be 1 inch above the crease of the elbow. Ideally, take 3 measurements at one sitting and record the average.

## 2021-11-07 NOTE — Assessment & Plan Note (Signed)
History of hyperlipidemia on statin therapy with lipid profile performed 11/25/2020 revealing a total cholesterol of 105, LDL of 62 and HDL 23.  We will recheck a fasting lipid liver profile today.

## 2021-11-08 LAB — HEPATIC FUNCTION PANEL
ALT: 35 IU/L (ref 0–44)
AST: 31 IU/L (ref 0–40)
Albumin: 4.7 g/dL (ref 3.8–4.8)
Alkaline Phosphatase: 74 IU/L (ref 44–121)
Bilirubin Total: 0.5 mg/dL (ref 0.0–1.2)
Bilirubin, Direct: 0.15 mg/dL (ref 0.00–0.40)
Total Protein: 7.2 g/dL (ref 6.0–8.5)

## 2021-11-08 LAB — LIPID PANEL
Chol/HDL Ratio: 4.5 ratio (ref 0.0–5.0)
Cholesterol, Total: 126 mg/dL (ref 100–199)
HDL: 28 mg/dL — ABNORMAL LOW (ref >39)
LDL Chol Calc (NIH): 72 mg/dL (ref 0–99)
Triglycerides: 146 mg/dL (ref 0–149)
VLDL Cholesterol Cal: 26 mg/dL (ref 5–40)

## 2021-12-06 ENCOUNTER — Other Ambulatory Visit: Payer: Self-pay | Admitting: Cardiovascular Disease

## 2021-12-15 ENCOUNTER — Other Ambulatory Visit: Payer: Self-pay

## 2021-12-15 ENCOUNTER — Ambulatory Visit (INDEPENDENT_AMBULATORY_CARE_PROVIDER_SITE_OTHER): Payer: Medicare Other | Admitting: Pharmacist

## 2021-12-15 VITALS — BP 159/102 | HR 70

## 2021-12-15 DIAGNOSIS — I10 Essential (primary) hypertension: Secondary | ICD-10-CM | POA: Diagnosis not present
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
## 2021-12-15 NOTE — Patient Instructions (Addendum)
It was nice meeting you today  We would like your blood pressure to be less than 130/80  Continue your olmesartan 40mg  daily  Continue your exercise routine and continue checking your blood pressure at home  The blood pressure cuffs we recommend are made by [employer]  Please send in your blood pressure readings from home and we will decide if we need to add on a new medication  Please call with any questions  Karren Cobble, PharmD, [place], [place], [place], [place], [place], [address] Phone: [phone], Fax: [phone]

## 2022-02-14 ENCOUNTER — Other Ambulatory Visit: Payer: Self-pay | Admitting: Cardiovascular Disease

## 2022-11-06 ENCOUNTER — Telehealth: Payer: Self-pay | Admitting: Cardiovascular Disease

## 2022-11-06 NOTE — Telephone Encounter (Signed)
Pt c/o BP issue: STAT if pt c/o blurred vision, one-sided weakness or slurred speech  1. What are your last 5 BP readings? 150/110  2. Are you having any other symptoms (ex. Dizziness, headache, blurred vision, passed out)? No; perfuse sweating  3. What is your BP issue? Abnormal high BP; wants to know if Dr. Gwenlyn Found wants to increase medication dosage

## 2022-11-06 NOTE — Telephone Encounter (Signed)
Called patient no answer.Left message on personal voice mail to call back.

## 2022-11-07 ENCOUNTER — Encounter (HOSPITAL_BASED_OUTPATIENT_CLINIC_OR_DEPARTMENT_OTHER): Payer: Self-pay | Admitting: Family

## 2022-11-07 ENCOUNTER — Ambulatory Visit (INDEPENDENT_AMBULATORY_CARE_PROVIDER_SITE_OTHER): Payer: Medicare Other | Admitting: Family

## 2022-11-07 ENCOUNTER — Other Ambulatory Visit (HOSPITAL_BASED_OUTPATIENT_CLINIC_OR_DEPARTMENT_OTHER): Payer: Self-pay

## 2022-11-07 VITALS — BP 164/122 | HR 86 | Ht 69.0 in | Wt 220.6 lb

## 2022-11-07 DIAGNOSIS — I10 Essential (primary) hypertension: Secondary | ICD-10-CM

## 2022-11-07 DIAGNOSIS — E782 Mixed hyperlipidemia: Secondary | ICD-10-CM | POA: Diagnosis not present

## 2022-11-07 MED ORDER — AMLODIPINE BESYLATE 5 MG PO TABS
5.0000 mg | ORAL_TABLET | Freq: Every day | ORAL | 2 refills | Status: DC
Start: 2022-11-07 — End: 2023-01-29
  Filled 2022-11-07: qty 30, 30d supply, fill #0
  Filled 2022-12-02: qty 30, 30d supply, fill #1
  Filled 2023-01-01: qty 30, 30d supply, fill #2
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
## 2022-11-07 NOTE — Telephone Encounter (Signed)
Called patient no answer.Left message on personal voice mail to call back.

## 2022-11-07 NOTE — Patient Instructions (Signed)
Medication Instructions:  Your physician has recommended you make the following change in your medication:   START Amlodipine '5mg'$  daily   *If you need a refill on your cardiac medications before your next appointment, please call your pharmacy*  Lab Work/Testing/Procedures: None ordered today.   Follow-Up: At [facility], you and your health needs are our priority.  As part of our continuing mission to provide you with exceptional heart care, we have created designated Provider Care Teams.  These Care Teams include your primary Cardiologist (physician) and Advanced Practice Providers (APPs -  Physician Assistants and Nurse Practitioners) who all work together to provide you with the care you need, when you need it.  We recommend signing up for the patient portal called "MyChart".  Sign up information is provided on this After Visit Summary.  MyChart is used to connect with patients for Virtual Visits (Telemedicine).  Patients are able to view lab/test results, encounter notes, upcoming appointments, etc.  Non-urgent messages can be sent to your provider as well.   To learn more about what you can do with MyChart, go to [email or web address].    Your next appointment:   In February as scheduled with Dr. Gwenlyn Found  Other Instructions  If you need medication to help with colds could use Coricidin, Claritin or Zyrtec, Mucinnex (Guafenesin)  Stay away from: Sudafed, anything labeled "-DM".  Tips to Measure your Blood Pressure Correctly  Please bring your blood pressure cuff to your next office visit.   Here's what you can do to ensure a correct reading:  Don't drink a caffeinated beverage or smoke during the 30 minutes before the test.  Sit quietly for five minutes before the test begins.  During the measurement, sit in a chair with your feet on the floor and your arm supported so your elbow is at about heart level.  The inflatable part of the cuff should completely cover at least  80% of your upper arm, and the cuff should be placed on bare skin, not over a shirt.  Don't talk during the measurement.   Blood pressure categories  Blood pressure category SYSTOLIC (upper number)  DIASTOLIC (lower number)  Normal Less than 120 mm Hg and Less than 80 mm Hg  Elevated 120-129 mm Hg and Less than 80 mm Hg  High blood pressure: Stage 1 hypertension 130-139 mm Hg or 80-89 mm Hg  High blood pressure: Stage 2 hypertension 140 mm Hg or higher or 90 mm Hg or higher  Hypertensive crisis (consult your doctor immediately) Higher than 180 mm Hg and/or Higher than 120 mm Hg  Source: American Heart Association and American Stroke Association. For more on getting your blood pressure under control, buy Controlling Your Blood Pressure, a Special Health Report from [facility].   Blood Pressure Log   Date   Time  Blood Pressure  Example: Nov 1 9 AM 124/78

## 2022-11-07 NOTE — Telephone Encounter (Signed)
Patient was returning call. Please advise ?

## 2022-11-07 NOTE — Telephone Encounter (Signed)
Patient returning call.

## 2022-11-07 NOTE — Telephone Encounter (Signed)
Spoke to patient he stated he lives at [facility].He is in town this week due to daughter having surgery.He requested appointment this week.Stated his B/P has been elevated.Today 166/113 pulse 103.Stated he recently had bronchitis and took several over the counter medications.Appointment scheduled with Laurann Montana NP this afternoon at 2:45 pm at [facility] office.

## 2022-12-03 ENCOUNTER — Encounter (HOSPITAL_COMMUNITY): Payer: Self-pay

## 2022-12-03 ENCOUNTER — Other Ambulatory Visit (HOSPITAL_COMMUNITY): Payer: Self-pay

## 2022-12-17 ENCOUNTER — Telehealth: Payer: Self-pay | Admitting: Hematology and Oncology

## 2022-12-17 NOTE — Telephone Encounter (Signed)
Scheduled appt per 2/12 referral. Pt is aware of appt date and time. Pt is aware to arrive 15 mins prior to appt time and to bring and updated insurance card. Pt is aware of appt location.

## 2022-12-20 ENCOUNTER — Inpatient Hospital Stay: Payer: Medicare Other | Attending: Hematology and Oncology | Admitting: Hematology and Oncology

## 2022-12-20 ENCOUNTER — Encounter: Payer: Self-pay | Admitting: Hematology and Oncology

## 2022-12-20 ENCOUNTER — Inpatient Hospital Stay: Payer: Medicare Other

## 2022-12-20 VITALS — BP 146/98 | HR 68 | Temp 97.8°F | Resp 16 | Ht 69.0 in | Wt 222.9 lb

## 2022-12-20 VITALS — BP 135/97 | HR 78 | Temp 97.8°F | Resp 18

## 2022-12-20 DIAGNOSIS — D751 Secondary polycythemia: Secondary | ICD-10-CM | POA: Insufficient documentation

## 2022-12-20 DIAGNOSIS — R0602 Shortness of breath: Secondary | ICD-10-CM | POA: Diagnosis not present

## 2022-12-20 DIAGNOSIS — D45 Polycythemia vera: Secondary | ICD-10-CM

## 2022-12-20 DIAGNOSIS — I10 Essential (primary) hypertension: Secondary | ICD-10-CM | POA: Diagnosis not present

## 2022-12-20 DIAGNOSIS — Z8719 Personal history of other diseases of the digestive system: Secondary | ICD-10-CM | POA: Diagnosis not present

## 2022-12-20 DIAGNOSIS — Z87891 Personal history of nicotine dependence: Secondary | ICD-10-CM | POA: Diagnosis not present

## 2022-12-20 DIAGNOSIS — E039 Hypothyroidism, unspecified: Secondary | ICD-10-CM | POA: Insufficient documentation

## 2022-12-20 DIAGNOSIS — Z8619 Personal history of other infectious and parasitic diseases: Secondary | ICD-10-CM | POA: Diagnosis not present

## 2022-12-20 DIAGNOSIS — Z79899 Other long term (current) drug therapy: Secondary | ICD-10-CM | POA: Diagnosis not present

## 2022-12-20 LAB — CBC WITH DIFFERENTIAL/PLATELET
Abs Immature Granulocytes: 0.02 10*3/uL (ref 0.00–0.07)
Basophils Absolute: 0.1 10*3/uL (ref 0.0–0.1)
Basophils Relative: 1 %
Eosinophils Absolute: 0.2 10*3/uL (ref 0.0–0.5)
Eosinophils Relative: 2 %
HCT: 54.8 % — ABNORMAL HIGH (ref 39.0–52.0)
Hemoglobin: 19.1 g/dL — ABNORMAL HIGH (ref 13.0–17.0)
Immature Granulocytes: 0 %
Lymphocytes Relative: 24 %
Lymphs Abs: 1.5 10*3/uL (ref 0.7–4.0)
MCH: 29.3 pg (ref 26.0–34.0)
MCHC: 34.9 g/dL (ref 30.0–36.0)
MCV: 84.2 fL (ref 80.0–100.0)
Monocytes Absolute: 0.7 10*3/uL (ref 0.1–1.0)
Monocytes Relative: 11 %
Neutro Abs: 3.9 10*3/uL (ref 1.7–7.7)
Neutrophils Relative %: 62 %
Platelets: 156 10*3/uL (ref 150–400)
RBC: 6.51 MIL/uL — ABNORMAL HIGH (ref 4.22–5.81)
RDW: 13.7 % (ref 11.5–15.5)
WBC: 6.3 10*3/uL (ref 4.0–10.5)
nRBC: 0 % (ref 0.0–0.2)

## 2022-12-20 LAB — CMP (CANCER CENTER ONLY)
ALT: 51 U/L — ABNORMAL HIGH (ref 0–44)
AST: 35 U/L (ref 15–41)
Albumin: 4.6 g/dL (ref 3.5–5.0)
Alkaline Phosphatase: 51 U/L (ref 38–126)
Anion gap: 7 (ref 5–15)
BUN: 24 mg/dL — ABNORMAL HIGH (ref 8–23)
CO2: 31 mmol/L (ref 22–32)
Calcium: 10 mg/dL (ref 8.9–10.3)
Chloride: 103 mmol/L (ref 98–111)
Creatinine: 1.11 mg/dL (ref 0.61–1.24)
GFR, Estimated: >60 mL/min (ref >60)
Glucose, Bld: 92 mg/dL (ref 70–99)
Potassium: 4.8 mmol/L (ref 3.5–5.1)
Sodium: 141 mmol/L (ref 135–145)
Total Bilirubin: 1.3 mg/dL — ABNORMAL HIGH (ref 0.3–1.2)
Total Protein: 7.7 g/dL (ref 6.5–8.1)
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
## 2022-12-20 NOTE — Progress Notes (Signed)
Lonnie Jackson presents today for phlebotomy per MD orders. Phlebotomy procedure started at 1202 and ended at 1208. 510 grams removed. Patient observed for 30 minutes after procedure without any incident. Patient tolerated procedure well. IV needle removed intact. Food and beverage provided.

## 2022-12-20 NOTE — Patient Instructions (Signed)

## 2022-12-21 ENCOUNTER — Ambulatory Visit: Payer: Medicare Other | Attending: Cardiovascular Disease | Admitting: Cardiovascular Disease

## 2022-12-21 ENCOUNTER — Encounter: Payer: Self-pay | Admitting: Cardiovascular Disease

## 2022-12-21 VITALS — BP 136/94 | HR 60 | Ht 69.0 in | Wt 222.4 lb

## 2022-12-21 DIAGNOSIS — E782 Mixed hyperlipidemia: Secondary | ICD-10-CM | POA: Diagnosis present

## 2022-12-21 DIAGNOSIS — G4733 Obstructive sleep apnea (adult) (pediatric): Secondary | ICD-10-CM | POA: Diagnosis present

## 2022-12-21 DIAGNOSIS — I10 Essential (primary) hypertension: Secondary | ICD-10-CM

## 2022-12-21 LAB — ERYTHROPOIETIN: Erythropoietin: 2.4 m[IU]/mL — ABNORMAL LOW (ref 2.6–18.5)

## 2022-12-21 NOTE — Progress Notes (Signed)
12/21/2022 [name]   1955-11-26  [id number]  Primary Physician Shon Baton, MD Primary Cardiologist: Lorretta Harp MD FACP, [place], [place], [place]  HPI:  Lonnie Jackson is a 67 y.o.  moderately overweight married Caucasian male father of 2, grandfather 3 grandchildren who I last saw in the office 11/07/2021.  He works for [employer], manufacturing systems.  He was referred by Dr. Virgina Jock for hypertension.  His cardiac risk factors include over 100-pack-year history tobacco abuse having quit 9 years ago.  He stopped drinking 40 years ago as well.  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  He had hypertension for the last 14 years since 2005.  He is on 3 antihypertensive medications.  2D echo performed 07/24/2017 revealed normal LV systolic function with moderate LVH.  He denies chest pain or shortness of breath.  Never had a heart attack or stroke.   He has been working with Cyril Mourning in our hypertension clinic for medication titration.  He says that he feels excessively fatigued on a beta-blocker and wishes to discontinue this.  A 2D echo performed 07/24/2017 was entirely normal.   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  His stress level has been markedly reduced.  He walks 5 to 6 miles a day.  He is lost some weight.  His medications have been simplified now taking only olmesartan for hypertension and Crestor for hyperlipidemia.    [place] I saw him a year ago [place] because of recent diagnosis of polycythemia vera.  He did see Laurann Montana, NP in the office a month ago for hypertension difficult to control on olmesartan and was begun on amlodipine in addition.  Blood pressure has been under better control.  He denies chest pain or shortness of breath.  Current Meds  Medication Sig   amLODipine (NORVASC) 5 MG tablet Take 1 tablet (5 mg total) by mouth daily.   olmesartan (BENICAR) 40 MG  tablet TAKE 1 TABLET BY MOUTH EVERY DAY   rosuvastatin (CRESTOR) 10 MG tablet Take 10 mg by mouth daily.     No Known Allergies  Social History   Socioeconomic History   Marital status: Married    Spouse name: Not on file   Number of children: Not on file   Years of education: Not on file   Highest education level: Not on file  Occupational History   Not on file  Tobacco Use   Smoking status: Former    Years: 40.00    Types: Cigarettes    Quit date: 09/25/2009    Years since quitting: 13.2   Smokeless tobacco: Never  Vaping Use   Vaping Use: Never used  Substance and Sexual Activity   Alcohol use: No   Drug use: No   Sexual activity: Not on file  Other Topics Concern   Not on file  Social History Narrative   Not on file   Social Determinants of Health   Financial Resource Strain: Not on file  Food Insecurity: Not on file  Transportation Needs: Not on file  Physical Activity: Not on file  Stress: Not on file  Social Connections: Not on file  Intimate Partner Violence: Not on file     Review of Systems: General: negative for chills, fever, night sweats or weight changes.  Cardiovascular: negative for chest pain, dyspnea on exertion, edema,  orthopnea, palpitations, paroxysmal nocturnal dyspnea or shortness of breath Dermatological: negative for rash Respiratory: negative for cough or wheezing Urologic: negative for hematuria Abdominal: negative for nausea, vomiting, diarrhea, bright red blood per rectum, melena, or hematemesis Neurologic: negative for visual changes, syncope, or dizziness All other systems reviewed and are otherwise negative except as noted above.    Blood pressure (!) 140/95, pulse 60, height 5' 9"$  (1.753 m), weight 222 lb 6.4 oz (100.9 kg), SpO2 97 %.  General appearance: alert and no distress Neck: no adenopathy, no carotid bruit, no JVD, supple, symmetrical, trachea midline, and thyroid not enlarged, symmetric, no  tenderness/mass/nodules Lungs: clear to auscultation bilaterally Heart: regular rate and rhythm, S1, S2 normal, no murmur, click, rub or gallop Extremities: extremities normal, atraumatic, no cyanosis or edema Pulses: 2+ and symmetric Skin: Skin color, texture, turgor normal. No rashes or lesions Neurologic: Grossly normal  EKG  he snores and is sinus rhythm at 60 without ST or T wave changes.  Personally reviewed this EKG.  ASSESSMENT AND PLAN:   Essential hypertension History of essential hypertension blood pressure measured today at 140/95.  I have reviewed his home blood pressure log which showed readings much better than this.  He is on Benicar and recently added amlodipine.  Hyperlipidemia History of hyperlipidemia on low-dose statin therapy with lipid profile performed 11/07/2021 revealing total cholesterol 126, LDL 72 and HDL 28.     Lorretta Harp MD [facility], [facility] 12/21/2022 10:50 AM

## 2022-12-21 NOTE — Assessment & Plan Note (Signed)
History of hyperlipidemia on low-dose statin therapy with lipid profile performed 11/07/2021 revealing total cholesterol 126, LDL 72 and HDL 28.

## 2022-12-21 NOTE — Assessment & Plan Note (Signed)
Complains of nocturnal snoring and daytime somnolence.  I am going to get an Intamar home sleep study to further evaluate.

## 2022-12-21 NOTE — Patient Instructions (Addendum)
Medication Instructions:  Your physician recommends that you continue on your current medications as directed. Please refer to the Current Medication list given to you today.  *If you need a refill on your cardiac medications before your next appointment, please call your pharmacy*    Testing/Procedures: WatchPAT?  Is a FDA cleared portable home sleep study test that uses a watch and 3 points of contact to monitor 7 different channels, including your heart rate, oxygen saturations, body position, snoring, and chest motion.  The study is easy to use from the comfort of your own home and accurately detect sleep apnea.  Before bed, you attach the chest sensor, attached the sleep apnea bracelet to your nondominant hand, and attach the finger probe.  After the study, the raw data is downloaded from the watch and scored for apnea events.   For more information: [email or web address]  Patient Testing Instructions:  Do not put battery into the device until bedtime when you are ready to begin the test. Please call the support number if you need assistance after following the instructions below: 24 hour support line- [phone] or ITAMAR support at [phone] (option 2)  Download the The First AmericanWatchPAT One" app through the google play store or App Store  Be sure to turn on or enable access to bluetooth in settlings on your smartphone/ device  Make sure no other bluetooth devices are on and within the vicinity of your smartphone/ device and WatchPAT watch during testing.  Make sure to leave your smart phone/ device plugged in and charging all night.  When ready for bed:  Follow the instructions step by step in the WatchPAT One App to activate the testing device. For additional instructions, including video instruction, visit the WatchPAT One video on Youtube. You can search for [place] One within Youtube (video is 4 minutes and 18 seconds) or enter:  https://youtube/watch?[email or web address]_vbiwxE Please note: You will be prompted to enter a Pin to connect via bluetooth when starting the test. The PIN will be assigned to you when you receive the test.  The device is disposable, but it recommended that you retain the device until you receive a call letting you know the study has been received and the results have been interpreted.  We will let you know if the study did not transmit to [place] properly after the test is completed. You do not need to call us to confirm the receipt of the test.  Please complete the test within 48 hours of receiving PIN.   Frequently Asked Questions:  What is Watch [place] one?  A single use fully disposable home sleep apnea testing device and will not need to be returned after completion.  What are the requirements to use WatchPAT one?  The be able to have a successful watchpat one sleep study, you should have your Watch pat one device, your smart phone, watch pat one app, your PIN number and Internet access What type of phone do I need?  You should have a smart phone that uses Android 5.1 and above or any Iphone with IOS 10 and above How can I download the WatchPAT one app?  Based on your device type search for WatchPAT one app either in google play for android devices or APP store for Iphone's Where will I get my PIN for the study?  Your PIN will be provided by your physician's office. It is used for authentication and if you lose/forget your PIN, please reach out to your providers office.  I do not  have Internet at home. Can I do WatchPAT one study?  WatchPAT One needs Internet connection throughout the night to be able to transmit the sleep data. You can use your home/local internet or your cellular's data package. However, it is always recommended to use home/local Internet. It is estimated that between 20MB-30MB will be used with each study.However, the application will be looking for 80MB space in the phone to start the study.   What happens if I lose internet or bluetooth connection?  During the internet disconnection, your phone will not be able to transmit the sleep data. All the data, will be stored in your phone. As soon as the internet connection is back on, the phone will being sending the sleep data. During the bluetooth disconnection, WatchPAT one will not be able to to send the sleep data to your phone. Data will be kept in the [facility] one until two devices have bluetooth connection back on. As soon as the connection is back on, WatchPAT one will send the sleep data to the phone.  How long do I need to wear the WatchPAT one?  After you start the study, you should wear the device at least 6 hours.  How far should I keep my phone from the device?  During the night, your phone should be within 15 feet.  What happens if I leave the room for restroom or other reasons?  Leaving the room for any reason will not cause any problem. As soon as your get back to the room, both devices will reconnect and will continue to send the sleep data. Can I use my phone during the sleep study?  Yes, you can use your phone as usual during the study. But it is recommended to put your watchpat one on when you are ready to go to bed.  How will I get my study results?  A soon as you completed your study, your sleep data will be sent to the provider. They will then share the results with you when they are ready.      Follow-Up: At [facility], you and your health needs are our priority.  As part of our continuing mission to provide you with exceptional heart care, we have created designated Provider Care Teams.  These Care Teams include your primary Cardiologist (physician) and Advanced Practice Providers (APPs -  Physician Assistants and Nurse Practitioners) who all work together to provide you with the care you need, when you need it.  We recommend signing up for the patient portal called "MyChart".  Sign up information is  provided on this After Visit Summary.  MyChart is used to connect with patients for Virtual Visits (Telemedicine).  Patients are able to view lab/test results, encounter notes, upcoming appointments, etc.  Non-urgent messages can be sent to your provider as well.   To learn more about what you can do with MyChart, go to [email or web address].    Your next appointment:   6 month(s)  Provider:   Fabian Sharp, PA-C, Sande Rives, PA-C, Caron Presume, PA-C, Jory Sims, DNP, ANP, Almyra Deforest, PA-C, or Diona Browner, NP      Then, Quay Burow, MD will plan to see you again in 12 month(s).

## 2022-12-21 NOTE — Assessment & Plan Note (Signed)
History of essential hypertension blood pressure measured today at 140/95.  I have reviewed his home blood pressure log which showed readings much better than this.  He is on Benicar and recently added amlodipine.

## 2022-12-25 ENCOUNTER — Telehealth: Payer: Self-pay

## 2022-12-25 ENCOUNTER — Telehealth: Payer: Self-pay | Admitting: Cardiovascular Disease

## 2022-12-25 ENCOUNTER — Telehealth: Payer: Self-pay | Admitting: *Deleted

## 2022-12-25 NOTE — Telephone Encounter (Signed)
I CALLED THE PATIENT CONCERNING HIS ITAMAR DEVICE, LVM TO RETURN MY CALL.

## 2022-12-25 NOTE — Telephone Encounter (Signed)
Pt is calling back regarding ITAMAR Device. Requesting to speak with Stacy. Please call back.

## 2022-12-25 NOTE — Telephone Encounter (Signed)
Called spoke to  patient. Patient states he asking when can he start the device. He states he was waiting fr a call.  RN  spoke to  representative in the office for Itamar device.   The representative states patient can start the device code was given.

## 2022-12-25 NOTE — Telephone Encounter (Signed)
Secure chat message sent to Debria Garret ok to have the patient to activate itamar device.

## 2022-12-25 NOTE — Telephone Encounter (Signed)
RN called patient . Code given . Patient voiced understanding

## 2022-12-26 ENCOUNTER — Telehealth: Payer: Self-pay

## 2022-12-26 ENCOUNTER — Telehealth: Payer: Self-pay | Admitting: *Deleted

## 2022-12-26 LAB — JAK2 (INCLUDING V617F AND EXON 12), MPL,& CALR W/RFL MPN PANEL (NGS)

## 2022-12-26 NOTE — Telephone Encounter (Signed)
Secure chat message sent to Debria Garret ok to have the patient to activate itamar device.

## 2022-12-26 NOTE — Telephone Encounter (Signed)
Called and made the patient aware that HE may proceed with the [facility] Sleep Study. PIN # provided to the patient. Patient made aware that HE will be contacted after the test has been read with the results and any recommendations. Patient verbalized understanding and thanked me for the call.

## 2022-12-27 ENCOUNTER — Other Ambulatory Visit: Payer: Self-pay | Admitting: *Deleted

## 2022-12-27 ENCOUNTER — Inpatient Hospital Stay: Payer: Medicare Other

## 2022-12-27 VITALS — BP 132/89 | HR 79 | Resp 17

## 2022-12-27 DIAGNOSIS — D45 Polycythemia vera: Secondary | ICD-10-CM

## 2022-12-27 DIAGNOSIS — D751 Secondary polycythemia: Secondary | ICD-10-CM | POA: Diagnosis not present

## 2022-12-27 LAB — CBC WITH DIFFERENTIAL/PLATELET
Abs Immature Granulocytes: 0.02 10*3/uL (ref 0.00–0.07)
Basophils Absolute: 0.1 10*3/uL (ref 0.0–0.1)
Basophils Relative: 1 %
Eosinophils Absolute: 0.2 10*3/uL (ref 0.0–0.5)
Eosinophils Relative: 2 %
HCT: 48.1 % (ref 39.0–52.0)
Hemoglobin: 16.9 g/dL (ref 13.0–17.0)
Immature Granulocytes: 0 %
Lymphocytes Relative: 26 %
Lymphs Abs: 1.7 10*3/uL (ref 0.7–4.0)
MCH: 29.4 pg (ref 26.0–34.0)
MCHC: 35.1 g/dL (ref 30.0–36.0)
MCV: 83.7 fL (ref 80.0–100.0)
Monocytes Absolute: 0.6 10*3/uL (ref 0.1–1.0)
Monocytes Relative: 9 %
Neutro Abs: 4.2 10*3/uL (ref 1.7–7.7)
Neutrophils Relative %: 62 %
Platelets: 188 10*3/uL (ref 150–400)
RBC: 5.75 MIL/uL (ref 4.22–5.81)
RDW: 13.2 % (ref 11.5–15.5)
WBC: 6.8 10*3/uL (ref 4.0–10.5)
nRBC: 0 % (ref 0.0–0.2)

## 2022-12-27 NOTE — Patient Instructions (Signed)

## 2022-12-27 NOTE — Progress Notes (Signed)
Per Dr Chryl Heck ok to proceed with phlebotomy with todays labs, she is correcting supportive plan treatment parameters

## 2022-12-27 NOTE — Progress Notes (Signed)
[name] presents today for phlebotomy per MD orders. Phlebotomy procedure started at 0956 and ended at 50. 18G to L AC. 509 grams removed. Patient observed for 30 minutes after procedure without any incident. Patient tolerated procedure well. IV needle removed intact.

## 2023-01-03 ENCOUNTER — Encounter (INDEPENDENT_AMBULATORY_CARE_PROVIDER_SITE_OTHER): Payer: Medicare Other | Admitting: Cardiology

## 2023-01-03 DIAGNOSIS — G4733 Obstructive sleep apnea (adult) (pediatric): Secondary | ICD-10-CM

## 2023-01-04 ENCOUNTER — Other Ambulatory Visit (HOSPITAL_COMMUNITY): Payer: Self-pay

## 2023-01-04 ENCOUNTER — Inpatient Hospital Stay: Payer: Medicare Other | Attending: Hematology and Oncology | Admitting: Hematology and Oncology

## 2023-01-04 ENCOUNTER — Inpatient Hospital Stay: Payer: Medicare Other

## 2023-01-04 VITALS — BP 142/99 | HR 76 | Temp 97.7°F | Resp 16 | Ht 69.0 in | Wt 223.5 lb

## 2023-01-04 VITALS — BP 126/92 | HR 70 | Temp 97.7°F | Resp 16

## 2023-01-04 DIAGNOSIS — D45 Polycythemia vera: Secondary | ICD-10-CM

## 2023-01-04 DIAGNOSIS — Z79899 Other long term (current) drug therapy: Secondary | ICD-10-CM | POA: Insufficient documentation

## 2023-01-04 DIAGNOSIS — E039 Hypothyroidism, unspecified: Secondary | ICD-10-CM | POA: Diagnosis not present

## 2023-01-04 DIAGNOSIS — I10 Essential (primary) hypertension: Secondary | ICD-10-CM | POA: Diagnosis not present

## 2023-01-04 DIAGNOSIS — E7800 Pure hypercholesterolemia, unspecified: Secondary | ICD-10-CM | POA: Diagnosis not present

## 2023-01-04 DIAGNOSIS — D751 Secondary polycythemia: Secondary | ICD-10-CM | POA: Insufficient documentation

## 2023-01-04 DIAGNOSIS — Z87891 Personal history of nicotine dependence: Secondary | ICD-10-CM | POA: Insufficient documentation

## 2023-01-04 LAB — CBC WITH DIFFERENTIAL/PLATELET
Abs Immature Granulocytes: 0.04 10*3/uL (ref 0.00–0.07)
Basophils Absolute: 0.1 10*3/uL (ref 0.0–0.1)
Basophils Relative: 1 %
Eosinophils Absolute: 0.2 10*3/uL (ref 0.0–0.5)
Eosinophils Relative: 2 %
HCT: 47.3 % (ref 39.0–52.0)
Hemoglobin: 16.6 g/dL (ref 13.0–17.0)
Immature Granulocytes: 1 %
Lymphocytes Relative: 24 %
Lymphs Abs: 1.9 10*3/uL (ref 0.7–4.0)
MCH: 29.6 pg (ref 26.0–34.0)
MCHC: 35.1 g/dL (ref 30.0–36.0)
MCV: 84.3 fL (ref 80.0–100.0)
Monocytes Absolute: 0.9 10*3/uL (ref 0.1–1.0)
Monocytes Relative: 12 %
Neutro Abs: 5 10*3/uL (ref 1.7–7.7)
Neutrophils Relative %: 60 %
Platelets: 166 10*3/uL (ref 150–400)
RBC: 5.61 MIL/uL (ref 4.22–5.81)
RDW: 13.2 % (ref 11.5–15.5)
WBC: 8.2 10*3/uL (ref 4.0–10.5)
nRBC: 0 % (ref 0.0–0.2)

## 2023-01-04 NOTE — Progress Notes (Signed)
Patient agreed to remain for post observation, left after 20 min, reporting he was "ready to go."  Tolerated procedure well, VSS at discharge.

## 2023-01-04 NOTE — Patient Instructions (Signed)
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
## 2023-01-15 ENCOUNTER — Telehealth: Payer: Self-pay | Admitting: Cardiovascular Disease

## 2023-01-15 NOTE — Telephone Encounter (Signed)
Pt is calling for his sleep study results, states he had his sleep study 2 weeks ago and hasn't heard anything regarding results. Pt states he is seeing a Hematologist now, and that he is having an issue with his blood, where his blood is creating too many red cells. He states he may have a type of blood cancer, or was told by the hematologist that it could be the issue that indicated need for the sleep study, or at least part of it. Pt stated that he is scheduled to have a bone marrow biopsy on 3/15 at [facility], but if it is determined prior to biopsy that the cause is due to the results from sleep study, he won't have to get the biopsy. Please advise.

## 2023-01-16 NOTE — Telephone Encounter (Signed)
Left message for pt regarding sleep study results.

## 2023-01-16 NOTE — Telephone Encounter (Signed)
Dr Radford Pax is out on leave and has not read any sleep studies in the past two weeks. We will reach out to you with your result as soon as she has started back reading.

## 2023-01-17 ENCOUNTER — Other Ambulatory Visit: Payer: Self-pay | Admitting: Student

## 2023-01-17 ENCOUNTER — Ambulatory Visit (HOSPITAL_COMMUNITY): Payer: Medicare Other

## 2023-01-17 ENCOUNTER — Other Ambulatory Visit: Payer: Self-pay | Admitting: Radiology

## 2023-01-17 DIAGNOSIS — D45 Polycythemia vera: Secondary | ICD-10-CM
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
## 2023-01-18 ENCOUNTER — Ambulatory Visit (HOSPITAL_COMMUNITY)
Admission: RE | Admit: 2023-01-18 | Discharge: 2023-01-18 | Disposition: A | Discharge status: Home / Self Care | Payer: Medicare Other | Source: Ambulatory Visit | Attending: Hematology and Oncology | Admitting: Hematology and Oncology

## 2023-01-18 ENCOUNTER — Encounter (HOSPITAL_COMMUNITY): Payer: Self-pay

## 2023-01-18 DIAGNOSIS — E039 Hypothyroidism, unspecified: Secondary | ICD-10-CM | POA: Insufficient documentation

## 2023-01-18 DIAGNOSIS — Z87891 Personal history of nicotine dependence: Secondary | ICD-10-CM | POA: Diagnosis not present

## 2023-01-18 DIAGNOSIS — E7800 Pure hypercholesterolemia, unspecified: Secondary | ICD-10-CM | POA: Diagnosis not present

## 2023-01-18 DIAGNOSIS — I10 Essential (primary) hypertension: Secondary | ICD-10-CM | POA: Diagnosis not present

## 2023-01-18 DIAGNOSIS — D45 Polycythemia vera: Secondary | ICD-10-CM | POA: Diagnosis present

## 2023-01-18 DIAGNOSIS — H9190 Unspecified hearing loss, unspecified ear: Secondary | ICD-10-CM | POA: Insufficient documentation

## 2023-01-18 DIAGNOSIS — Z1379 Encounter for other screening for genetic and chromosomal anomalies: Secondary | ICD-10-CM | POA: Diagnosis not present

## 2023-01-18 HISTORY — PX: IR BONE MARROW BIOPSY & ASPIRATION: IMG5727

## 2023-01-18 LAB — CBC WITH DIFFERENTIAL/PLATELET
Abs Immature Granulocytes: 0.05 10*3/uL (ref 0.00–0.07)
Basophils Absolute: 0.1 10*3/uL (ref 0.0–0.1)
Basophils Relative: 1 %
Eosinophils Absolute: 0.2 10*3/uL (ref 0.0–0.5)
Eosinophils Relative: 3 %
HCT: 43 % (ref 39.0–52.0)
Hemoglobin: 14.9 g/dL (ref 13.0–17.0)
Immature Granulocytes: 1 %
Lymphocytes Relative: 28 %
Lymphs Abs: 1.6 10*3/uL (ref 0.7–4.0)
MCH: 29.4 pg (ref 26.0–34.0)
MCHC: 34.7 g/dL (ref 30.0–36.0)
MCV: 84.8 fL (ref 80.0–100.0)
Monocytes Absolute: 0.6 10*3/uL (ref 0.1–1.0)
Monocytes Relative: 11 %
Neutro Abs: 3.2 10*3/uL (ref 1.7–7.7)
Neutrophils Relative %: 56 %
Platelets: 147 10*3/uL — ABNORMAL LOW (ref 150–400)
RBC: 5.07 MIL/uL (ref 4.22–5.81)
RDW: 13.7 % (ref 11.5–15.5)
WBC: 5.7 10*3/uL (ref 4.0–10.5)
nRBC: 0 % (ref 0.0–0.2)

## 2023-01-18 MED ORDER — FENTANYL CITRATE (PF) 100 MCG/2ML IJ SOLN
INTRAMUSCULAR | Status: AC
  Filled 2023-01-18: qty 2

## 2023-01-18 MED ORDER — LIDOCAINE HCL (PF) 1 % IJ SOLN
INTRAMUSCULAR | Status: AC
  Administered 2023-01-18: 10 mL
  Filled 2023-01-18: qty 30

## 2023-01-18 MED ORDER — MIDAZOLAM HCL 2 MG/2ML IJ SOLN
INTRAMUSCULAR | Status: AC | PRN
  Administered 2023-01-18 (×2): 1 mg via INTRAVENOUS

## 2023-01-18 MED ORDER — MIDAZOLAM HCL 2 MG/2ML IJ SOLN
INTRAMUSCULAR | Status: AC
  Filled 2023-01-18: qty 2

## 2023-01-18 MED ORDER — SODIUM CHLORIDE 0.9 % IV SOLN: INTRAVENOUS | Status: DC

## 2023-01-18 MED ORDER — FENTANYL CITRATE (PF) 100 MCG/2ML IJ SOLN
INTRAMUSCULAR | Status: AC | PRN
  Administered 2023-01-18 (×2): 50 ug via INTRAVENOUS

## 2023-01-18 NOTE — Sedation Documentation (Signed)
Sample #2 obtained 

## 2023-01-18 NOTE — Discharge Instructions (Signed)
Bone Marrow Aspiration and Bone Marrow Biopsy, Adult, Care After  The following information offers guidance on how to care for yourself after your procedure. Your health care provider may also give you more specific instructions. If you have problems or questions, contact your health care provider.  What can I expect after the procedure?  May remove dressing or bandaid and shower tomorrow.  Keep site clean and dry. Replace with clean dressing or bandaid as necessary. Urgent needs IR clinic [phone] (mon-fri 8-5).  After the procedure, it is common to have: Mild pain and tenderness. Swelling. Bruising. Follow these instructions at home: Incision care  Follow instructions from your health care provider about how to take care of the incision site. Make sure you: Wash your hands with soap and water for at least 20 seconds before and after you change your bandage (dressing). If soap and water are not available, use hand sanitizer. Change your dressing as told by your health care provider. Leave stitches (sutures), skin glue, or adhesive strips in place. These skin closures may need to stay in place for 2 weeks or longer. If adhesive strip edges start to loosen and curl up, you may trim the loose edges. Do not remove adhesive strips completely unless your health care provider tells you to do that. Check your incision site every day for signs of infection. Check for: More redness, swelling, or pain. Fluid or blood. Warmth. Pus or a bad smell. Activity Return to your normal activities as told by your health care provider. Ask your health care provider what activities are safe for you. Do not lift anything that is heavier than 10 lb (4.5 kg), or the limit that you are told, until your health care provider says that it is safe. If you were given a sedative during the procedure, it can affect you for  several hours. Do not drive or operate machinery until your health care provider says that it is safe. General instructions  Take over-the-counter and prescription medicines only as told by your health care provider. Do not take baths, swim, or use a hot tub until your health care provider approves. Ask your health care provider if you may take showers. You may only be allowed to take sponge baths. If directed, put ice on the affected area. To do this: Put ice in a plastic bag. Place a towel between your skin and the bag. Leave the ice on for 20 minutes, 2-3 times a day. If your skin turns bright red, remove the ice right away to prevent skin damage. The risk of skin damage is higher if you cannot feel pain, heat, or cold. Contact a health care provider if: You have signs of infection. Your pain is not controlled with medicine. You have cancer, and a temperature of 100.4F (38C) or higher. Get help right away if: You have a temperature of 101F (38.3C) or higher, or as told by your health care provider. You have bleeding from the incision site that cannot be controlled. This information is not intended to replace advice given to you by your health care provider. Make sure you discuss any questions you have with your health care provider. Document Revised: 02/26/2022 Document Reviewed: 02/26/2022 Elsevier Patient Education  2023 Elsevier Inc.                            Moderate Conscious Sedation, Adult, Care After  This sheet gives you information about how to care   for yourself after your procedure. Your health care provider may also give you more specific instructions. If you have problems or questions, contact your health care provider. What can I expect after the procedure? After the procedure, it is common to have: Sleepiness for several hours. Impaired judgment for several hours. Difficulty with balance. Vomiting if you eat too soon. Follow these instructions at home: For  the time period you were told by your health care provider:     Rest. Do not participate in activities where you could fall or become injured. Do not drive or use machinery. Do not drink alcohol. Do not take sleeping pills or medicines that cause drowsiness. Do not make important decisions or sign legal documents. Do not take care of children on your own. Eating and drinking  Follow the diet recommended by your health care provider. Drink enough fluid to keep your urine pale yellow. If you vomit: Drink water, juice, or soup when you can drink without vomiting. Make sure you have little or no nausea before eating solid foods. General instructions Take over-the-counter and prescription medicines only as told by your health care provider. Have a responsible adult stay with you for the time you are told. It is important to have someone help care for you until you are awake and alert. Do not smoke. Keep all follow-up visits as told by your health care provider. This is important. Contact a health care provider if: You are still sleepy or having trouble with balance after 24 hours. You feel light-headed. You keep feeling nauseous or you keep vomiting. You develop a rash. You have a fever. You have redness or swelling around the IV site. Get help right away if: You have trouble breathing. You have new-onset confusion at home. Summary After the procedure, it is common to feel sleepy, have impaired judgment, or feel nauseous if you eat too soon. Rest after you get home. Know the things you should not do after the procedure. Follow the diet recommended by your health care provider and drink enough fluid to keep your urine pale yellow. Get help right away if you have trouble breathing or new-onset confusion at home. This information is not intended to replace advice given to you by your health care provider. Make sure you discuss any questions you have with your health care  provider. Document Revised: 02/19/2020 Document Reviewed: 09/17/2019 Elsevier Patient Education  2023 Elsevier Inc.  

## 2023-01-18 NOTE — Sedation Documentation (Signed)
Sample #1 obtained 

## 2023-01-18 NOTE — Procedures (Signed)
Interventional Radiology Procedure Note  Procedure:  Image guided aspirate and core biopsy of right posterior iliac bone Complications: None Recommendations: - Bedrest supine x 1 hrs - OTC's PRN  Pain - Follow biopsy results  Signed,  Kaylan Yates S. Kalysta Kneisley, DO   

## 2023-01-21 ENCOUNTER — Ambulatory Visit: Payer: Medicare Other | Attending: Cardiovascular Disease

## 2023-01-21 DIAGNOSIS — I10 Essential (primary) hypertension: Secondary | ICD-10-CM

## 2023-01-21 DIAGNOSIS — E782 Mixed hyperlipidemia: Secondary | ICD-10-CM

## 2023-01-21 DIAGNOSIS — G4733 Obstructive sleep apnea (adult) (pediatric): Secondary | ICD-10-CM

## 2023-01-21 LAB — SURGICAL PATHOLOGY

## 2023-01-21 NOTE — Procedures (Signed)
Patient Information Study Date: 01/02/2023 Patient Name: Lonnie Jackson Patient ID: [id number] Birth Date: [birth year] Age: 67 Gender: Male BMI: 33.0 (W=222 lb, H=5' 9'') Referring Physician: Quay Burow, MD  TEST DESCRIPTION:  Home sleep apnea testing was completed using the WatchPat, a Type 1 device, utilizing peripheral arterial tonometry (PAT), chest movement, actigraphy, pulse oximetry, pulse rate, body position and snore.  AHI was calculated with apnea and hypopnea using valid sleep time as the denominator. RDI includes apneas, hypopneas, and RERAs.  The data acquired and the scoring of sleep and all associated events were performed in accordance with the recommended standards and specifications as outlined in the AASM Manual for the Scoring of Sleep and Associated Events 2.2.0 (2015).  FINDINGS:  1.  Moderate Obstructive Sleep Apnea with AHI 15.4/hr.   2.  Central Sleep Apnea with pAHIc 0/hr.  3.  Oxygen desaturations as low as 86%.  4.  Moderate to severe snoring was present. O2 sats were < 88% for 0.8 min.  5.  Total sleep time was 5 hrs and 59 min.  6.  25.3% of total sleep time was spent in REM sleep.   7.  Shortened sleep onset latency at 6 min  8.  Normal REM sleep onset latency at 82 min.   9.  Total awakenings were 13.  10. Arrhythmia detection:  None  DIAGNOSIS:   Moderate Obstructive Sleep Apnea (G47.33)  RECOMMENDATIONS:   1.  Clinical correlation of these findings is necessary.  The decision to treat obstructive sleep apnea (OSA) is usually based on the presence of apnea symptoms or the presence of associated medical conditions such as Hypertension, Congestive Heart Failure, Atrial Fibrillation or Obesity.  The most common symptoms of OSA are snoring, gasping for breath while sleeping, daytime sleepiness and fatigue.   2.  Initiating apnea therapy is recommended given the presence of symptoms and/or associated conditions. Recommend proceeding with one of the  following:     a.  Auto-CPAP therapy with a pressure range of 5-20cm H2O.     b.  An oral appliance (OA) that can be obtained from certain dentists with expertise in sleep medicine.  These are primarily of use in non-obese patients with mild and moderate disease.     c.  An ENT consultation which may be useful to look for specific causes of obstruction and possible treatment options.     d.  If patient is intolerant to PAP therapy, consider referral to ENT for evaluation for hypoglossal nerve stimulator.   3.  Close follow-up is necessary to ensure success with CPAP or oral appliance therapy for maximum benefit.  4.  A follow-up oximetry study on CPAP is recommended to assess the adequacy of therapy and determine the need for supplemental oxygen or the potential need for Bi-level therapy.  An arterial blood gas to determine the adequacy of baseline ventilation and oxygenation should also be considered.  5.  Healthy sleep recommendations include:  adequate nightly sleep (normal 7-9 hrs/night), avoidance of caffeine after noon and alcohol near bedtime, and maintaining a sleep environment that is cool, dark and quiet.  6.  Weight loss for overweight patients is recommended.  Even modest amounts of weight loss can significantly improve the severity of sleep apnea.  7.  Snoring recommendations include:  weight loss where appropriate, side sleeping, and avoidance of alcohol before bed.  8.  Operation of motor vehicle should not be performed when sleepy.  Signature: Fransico Him, MD; [facility]; [place], [place] Board  of Sleep Medicine  Electronically Signed: 01/21/2023 Rev.

## 2023-01-23 ENCOUNTER — Telehealth: Payer: Self-pay | Admitting: *Deleted

## 2023-01-23 ENCOUNTER — Other Ambulatory Visit: Payer: Self-pay | Admitting: Cardiology

## 2023-01-23 DIAGNOSIS — G4733 Obstructive sleep apnea (adult) (pediatric): Secondary | ICD-10-CM

## 2023-01-23 NOTE — Telephone Encounter (Signed)
Patient returned a call to me and was given sleep study results and recommendations. He agrees to proceed with CPAP therapy. [employer] notified orders in EPIC.

## 2023-01-23 NOTE — Telephone Encounter (Signed)
-----   Message from Sueanne Margarita, MD sent at 01/21/2023  7:11 AM EDT ----- Please let patient know that they have sleep apnea and recommend treating with CPAP.  Please order an auto CPAP from 4-15cm H2O with heated humidity and mask of choice.  Order overnight pulse ox on CPAP.  Followup with me in 6 weeks.

## 2023-01-23 NOTE — Telephone Encounter (Signed)
Left message to return a call to discuss sleep study results. 

## 2023-01-25 ENCOUNTER — Inpatient Hospital Stay (HOSPITAL_BASED_OUTPATIENT_CLINIC_OR_DEPARTMENT_OTHER): Payer: Medicare Other | Admitting: Hematology and Oncology

## 2023-01-25 ENCOUNTER — Inpatient Hospital Stay: Payer: Medicare Other

## 2023-01-25 VITALS — BP 140/88 | HR 72 | Temp 97.8°F | Resp 16 | Ht 69.0 in | Wt 224.2 lb

## 2023-01-25 DIAGNOSIS — D751 Secondary polycythemia: Secondary | ICD-10-CM

## 2023-01-25 DIAGNOSIS — D45 Polycythemia vera: Secondary | ICD-10-CM

## 2023-01-25 LAB — CBC WITH DIFFERENTIAL/PLATELET
Abs Immature Granulocytes: 0.03 10*3/uL (ref 0.00–0.07)
Basophils Absolute: 0.1 10*3/uL (ref 0.0–0.1)
Basophils Relative: 1 %
Eosinophils Absolute: 0.2 10*3/uL (ref 0.0–0.5)
Eosinophils Relative: 2 %
HCT: 45.7 % (ref 39.0–52.0)
Hemoglobin: 16.3 g/dL (ref 13.0–17.0)
Immature Granulocytes: 0 %
Lymphocytes Relative: 26 %
Lymphs Abs: 2 10*3/uL (ref 0.7–4.0)
MCH: 30.4 pg (ref 26.0–34.0)
MCHC: 35.7 g/dL (ref 30.0–36.0)
MCV: 85.3 fL (ref 80.0–100.0)
Monocytes Absolute: 0.9 10*3/uL (ref 0.1–1.0)
Monocytes Relative: 12 %
Neutro Abs: 4.4 10*3/uL (ref 1.7–7.7)
Neutrophils Relative %: 59 %
Platelets: 148 10*3/uL — ABNORMAL LOW (ref 150–400)
RBC: 5.36 MIL/uL (ref 4.22–5.81)
RDW: 13.8 % (ref 11.5–15.5)
WBC: 7.6 10*3/uL (ref 4.0–10.5)
nRBC: 0 % (ref 0.0–0.2)
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
## 2023-01-28 ENCOUNTER — Telehealth: Payer: Self-pay | Admitting: Hematology and Oncology

## 2023-01-28 NOTE — Telephone Encounter (Signed)
Spoke with patient confirming upcoming appointments  

## 2023-01-29 ENCOUNTER — Other Ambulatory Visit (HOSPITAL_BASED_OUTPATIENT_CLINIC_OR_DEPARTMENT_OTHER): Payer: Self-pay | Admitting: Family

## 2023-01-29 ENCOUNTER — Encounter (HOSPITAL_COMMUNITY): Payer: Self-pay | Admitting: Hematology and Oncology

## 2023-01-29 ENCOUNTER — Other Ambulatory Visit: Payer: Self-pay

## 2023-01-29 ENCOUNTER — Other Ambulatory Visit (HOSPITAL_COMMUNITY): Payer: Self-pay

## 2023-01-29 DIAGNOSIS — I10 Essential (primary) hypertension: Secondary | ICD-10-CM

## 2023-01-29 MED ORDER — AMLODIPINE BESYLATE 5 MG PO TABS
5.0000 mg | ORAL_TABLET | Freq: Every day | ORAL | 3 refills | Status: DC
  Filled 2023-01-29: qty 90, 90d supply, fill #0
  Filled 2023-04-29: qty 90, 90d supply, fill #1
  Filled 2023-07-30: qty 90, 90d supply, fill #2
  Filled 2024-01-01: qty 90, 90d supply, fill #3

## 2023-01-29 NOTE — Telephone Encounter (Signed)
Rx(s) sent to pharmacy electronically.  

## 2023-01-30 ENCOUNTER — Other Ambulatory Visit: Payer: Self-pay | Admitting: Cardiovascular Disease
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
## 2023-04-30 ENCOUNTER — Encounter: Payer: Self-pay | Admitting: Cardiology

## 2023-04-30 ENCOUNTER — Telehealth: Payer: Self-pay | Admitting: *Deleted

## 2023-04-30 ENCOUNTER — Ambulatory Visit: Payer: Medicare Other | Attending: Cardiology | Admitting: Cardiology

## 2023-04-30 ENCOUNTER — Encounter: Payer: Self-pay | Admitting: Hematology and Oncology

## 2023-04-30 VITALS — BP 121/82 | HR 62 | Ht 70.0 in | Wt 218.0 lb

## 2023-04-30 DIAGNOSIS — G4733 Obstructive sleep apnea (adult) (pediatric): Secondary | ICD-10-CM

## 2023-04-30 DIAGNOSIS — I10 Essential (primary) hypertension: Secondary | ICD-10-CM

## 2023-04-30 NOTE — Telephone Encounter (Signed)
Per Dr Mayford Knife, change the auto CPAP to 4-12cm H2O and get a download in 4 weeks   Order placed to [facility] via community message.

## 2023-05-30 ENCOUNTER — Inpatient Hospital Stay: Payer: Medicare Other | Attending: Hematology and Oncology | Admitting: Hematology and Oncology

## 2023-05-30 ENCOUNTER — Other Ambulatory Visit: Payer: Self-pay | Admitting: *Deleted

## 2023-05-30 ENCOUNTER — Encounter: Payer: Self-pay | Admitting: Hematology and Oncology

## 2023-05-30 ENCOUNTER — Inpatient Hospital Stay: Payer: Medicare Other

## 2023-05-30 VITALS — BP 140/87 | HR 70 | Temp 97.3°F | Resp 18 | Ht 70.0 in | Wt 219.7 lb

## 2023-05-30 DIAGNOSIS — Z87891 Personal history of nicotine dependence: Secondary | ICD-10-CM | POA: Insufficient documentation

## 2023-05-30 DIAGNOSIS — D45 Polycythemia vera: Secondary | ICD-10-CM

## 2023-05-30 DIAGNOSIS — Z8619 Personal history of other infectious and parasitic diseases: Secondary | ICD-10-CM | POA: Diagnosis not present

## 2023-05-30 DIAGNOSIS — Z8719 Personal history of other diseases of the digestive system: Secondary | ICD-10-CM | POA: Diagnosis not present

## 2023-05-30 DIAGNOSIS — D751 Secondary polycythemia: Secondary | ICD-10-CM | POA: Insufficient documentation

## 2023-05-30 DIAGNOSIS — I10 Essential (primary) hypertension: Secondary | ICD-10-CM | POA: Insufficient documentation

## 2023-05-30 DIAGNOSIS — E039 Hypothyroidism, unspecified: Secondary | ICD-10-CM | POA: Diagnosis not present

## 2023-05-30 DIAGNOSIS — Z79899 Other long term (current) drug therapy: Secondary | ICD-10-CM | POA: Insufficient documentation

## 2023-05-30 LAB — CBC WITH DIFFERENTIAL (CANCER CENTER ONLY)
Abs Immature Granulocytes: 0.04 10*3/uL (ref 0.00–0.07)
Basophils Absolute: 0.1 10*3/uL (ref 0.0–0.1)
Basophils Relative: 1 %
Eosinophils Absolute: 0.1 10*3/uL (ref 0.0–0.5)
Eosinophils Relative: 2 %
HCT: 43.8 % (ref 39.0–52.0)
Hemoglobin: 15.6 g/dL (ref 13.0–17.0)
Immature Granulocytes: 1 %
Lymphocytes Relative: 21 %
Lymphs Abs: 1.7 10*3/uL (ref 0.7–4.0)
MCH: 30.8 pg (ref 26.0–34.0)
MCHC: 35.6 g/dL (ref 30.0–36.0)
MCV: 86.6 fL (ref 80.0–100.0)
Monocytes Absolute: 0.9 10*3/uL (ref 0.1–1.0)
Monocytes Relative: 11 %
Neutro Abs: 5.1 10*3/uL (ref 1.7–7.7)
Neutrophils Relative %: 64 %
Platelet Count: 135 10*3/uL — ABNORMAL LOW (ref 150–400)
RBC: 5.06 MIL/uL (ref 4.22–5.81)
RDW: 12.6 % (ref 11.5–15.5)
WBC Count: 7.9 10*3/uL (ref 4.0–10.5)
nRBC: 0 % (ref 0.0–0.2)

## 2023-05-30 LAB — CMP (CANCER CENTER ONLY)
ALT: 35 U/L (ref 0–44)
AST: 26 U/L (ref 15–41)
Albumin: 4.4 g/dL (ref 3.5–5.0)
Alkaline Phosphatase: 57 U/L (ref 38–126)
Anion gap: 5 (ref 5–15)
BUN: 23 mg/dL (ref 8–23)
CO2: 30 mmol/L (ref 22–32)
Calcium: 9.5 mg/dL (ref 8.9–10.3)
Chloride: 105 mmol/L (ref 98–111)
Creatinine: 1.25 mg/dL — ABNORMAL HIGH (ref 0.61–1.24)
GFR, Estimated: >60 mL/min (ref >60)
Glucose, Bld: 89 mg/dL (ref 70–99)
Potassium: 4.2 mmol/L (ref 3.5–5.1)
Sodium: 140 mmol/L (ref 135–145)
Total Bilirubin: 0.5 mg/dL (ref 0.3–1.2)
Total Protein: 6.9 g/dL (ref 6.5–8.1)
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
## 2023-05-31 ENCOUNTER — Encounter: Payer: Self-pay | Admitting: Hematology and Oncology

## 2023-06-11 ENCOUNTER — Encounter: Payer: Self-pay | Admitting: Cardiology

## 2023-09-13 ENCOUNTER — Ambulatory Visit: Payer: Medicare Other | Attending: General Practice | Admitting: General Practice

## 2023-09-13 ENCOUNTER — Encounter: Payer: Self-pay | Admitting: General Practice

## 2023-09-13 VITALS — BP 130/88 | HR 88 | Ht 70.0 in | Wt 219.0 lb

## 2023-09-13 DIAGNOSIS — I10 Essential (primary) hypertension: Secondary | ICD-10-CM

## 2023-09-13 DIAGNOSIS — E782 Mixed hyperlipidemia: Secondary | ICD-10-CM

## 2023-09-13 DIAGNOSIS — G4733 Obstructive sleep apnea (adult) (pediatric): Secondary | ICD-10-CM
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
## 2023-09-13 NOTE — Patient Instructions (Signed)
Medication Instructions:  The current medical regimen is effective;  continue present plan and medications as directed. Please refer to the Current Medication list given to you today.  *If you need a refill on your cardiac medications before your next appointment, please call your pharmacy*  Lab Work: NONE  Other Instructions INCREASE PHYSICAL ACTIVITY-AS TOLERATED CONTINUE DIET  Follow-Up: At [facility], you and your health needs are our priority.  As part of our continuing mission to provide you with exceptional heart care, we have created designated Provider Care Teams.  These Care Teams include your primary Cardiologist (physician) and Advanced Practice Providers (APPs -  Physician Assistants and Nurse Practitioners) who all work together to provide you with the care you need, when you need it.  Your next appointment:   12 month(s)  Provider:   Nanetta Batty, MD

## 2023-11-27 ENCOUNTER — Inpatient Hospital Stay: Payer: Medicare Other | Attending: Adult Health

## 2023-11-27 DIAGNOSIS — D45 Polycythemia vera: Secondary | ICD-10-CM | POA: Insufficient documentation

## 2023-11-27 LAB — CBC WITH DIFFERENTIAL/PLATELET
Abs Immature Granulocytes: 0.04 10*3/uL (ref 0.00–0.07)
Basophils Absolute: 0.1 10*3/uL (ref 0.0–0.1)
Basophils Relative: 1 %
Eosinophils Absolute: 0.2 10*3/uL (ref 0.0–0.5)
Eosinophils Relative: 3 %
HCT: 55 % — ABNORMAL HIGH (ref 39.0–52.0)
Hemoglobin: 18.2 g/dL — ABNORMAL HIGH (ref 13.0–17.0)
Immature Granulocytes: 1 %
Lymphocytes Relative: 21 %
Lymphs Abs: 1.6 10*3/uL (ref 0.7–4.0)
MCH: 28 pg (ref 26.0–34.0)
MCHC: 33.1 g/dL (ref 30.0–36.0)
MCV: 84.6 fL (ref 80.0–100.0)
Monocytes Absolute: 1.1 10*3/uL — ABNORMAL HIGH (ref 0.1–1.0)
Monocytes Relative: 14 %
Neutro Abs: 4.7 10*3/uL (ref 1.7–7.7)
Neutrophils Relative %: 60 %
Platelets: 121 10*3/uL — ABNORMAL LOW (ref 150–400)
RBC: 6.5 MIL/uL — ABNORMAL HIGH (ref 4.22–5.81)
RDW: 13.7 % (ref 11.5–15.5)
WBC: 7.7 10*3/uL (ref 4.0–10.5)
nRBC: 0 % (ref 0.0–0.2)

## 2023-11-29 ENCOUNTER — Inpatient Hospital Stay (HOSPITAL_BASED_OUTPATIENT_CLINIC_OR_DEPARTMENT_OTHER): Payer: Medicare Other | Admitting: Adult Health

## 2023-11-29 ENCOUNTER — Encounter: Payer: Self-pay | Admitting: Adult Health

## 2023-11-29 VITALS — BP 131/68 | HR 64 | Temp 97.5°F | Resp 18 | Ht 70.0 in | Wt 229.2 lb

## 2023-11-29 DIAGNOSIS — D45 Polycythemia vera: Secondary | ICD-10-CM
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
## 2023-12-04 ENCOUNTER — Encounter: Payer: Self-pay | Admitting: Hematology and Oncology

## 2023-12-04 NOTE — Assessment & Plan Note (Signed)
Polycythemia Hemoglobin increased from 15.6 to 18.2 over the past six months, increasing risk of stroke. No recent blood donations or medication changes reported. --Offered phlebotomy and patient declined -Recommend blood donation every three months to maintain hemoglobin around 16 -Recommended he restart CPAP use -Repeat labs in six months to monitor hemoglobin levels.

## 2024-01-01 ENCOUNTER — Other Ambulatory Visit (HOSPITAL_COMMUNITY): Payer: Self-pay

## 2024-01-10 ENCOUNTER — Telehealth: Payer: Self-pay | Admitting: Cardiovascular Disease

## 2024-01-10 NOTE — Telephone Encounter (Signed)
 Spoke with pt. Verified name and DOB. Pt states his blood pressure has been elevated recently with the following readings Monday 160/110  Tuesday 174/114 Wednesday 169/111 Thursday 164/102 No reading today and unable to check at the moment. Patient reports no symptoms of HA, blurred vision, confusion, shortness of breath, chest pain, dizziness. Patient states he is compliant with medications and took his medications today as prescribed. Pt states he is going under a lot of stress caring for his wife and believes that stress is affecting his health. Patient states his diet has "not been the best" lately and believes sodium intake has increased in the past 2 weeks. Scheduled an appt to see Dr. Allyson Sabal on 01/29/2024 at 3:15 pm. Advised patient to keep a log of his blood pressure readings checking twice daily and bring to next appointment. If any symptoms mentioned above arise, go to emergency room for evaluation. Notified pt msg would be forwarded to provider to advise. Pt verbalized understanding.   Josie LPN

## 2024-01-10 NOTE — Telephone Encounter (Signed)
 Pt c/o BP issue: STAT if pt c/o blurred vision, one-sided weakness or slurred speech.  STAT if BP is GREATER than 180/120 TODAY.  STAT if BP is LESS than 90/60 and SYMPTOMATIC TODAY  1. What is your BP concern? BP is high  2. Have you taken any BP medication today?yes  3. What are your last 5 BP readings?160/110,164/105,174/114  4. Are you having any other symptoms (ex. Dizziness, headache, blurred vision, passed out)? no

## 2024-01-10 NOTE — Telephone Encounter (Signed)
 Patient is calling back to talk with a nurse about BP

## 2024-01-28 ENCOUNTER — Encounter: Payer: Self-pay | Admitting: Hematology and Oncology

## 2024-01-29 ENCOUNTER — Encounter: Payer: Self-pay | Admitting: Cardiovascular Disease

## 2024-01-29 ENCOUNTER — Other Ambulatory Visit (HOSPITAL_COMMUNITY): Payer: Self-pay

## 2024-01-29 ENCOUNTER — Ambulatory Visit: Attending: Cardiovascular Disease | Admitting: Cardiovascular Disease

## 2024-01-29 VITALS — BP 144/110 | HR 67 | Ht 70.0 in | Wt 228.4 lb

## 2024-01-29 DIAGNOSIS — E782 Mixed hyperlipidemia: Secondary | ICD-10-CM

## 2024-01-29 DIAGNOSIS — I10 Essential (primary) hypertension: Secondary | ICD-10-CM | POA: Diagnosis not present

## 2024-01-29 MED ORDER — AMLODIPINE BESYLATE 10 MG PO TABS
10.0000 mg | ORAL_TABLET | Freq: Every day | ORAL | 3 refills | Status: DC
  Filled 2024-01-29 – 2024-02-13 (×2): qty 90, 90d supply, fill #0

## 2024-01-29 NOTE — Assessment & Plan Note (Signed)
 History of hyperlipidemia on rosuvastatin with lipid profile performed 12/18/2022 revealing total cholesterol 137, LDL 85 and HDL 31.

## 2024-01-29 NOTE — Assessment & Plan Note (Signed)
 History of essential hypertension her blood pressure measured today at 144/110.  He is on amlodipine and olmesartan.  He admits to being under a lot of stress.  I am going to increase his amlodipine from 5 to 10 mg a day.  Will have him come back to see an APP in 4 weeks to review.

## 2024-01-29 NOTE — Patient Instructions (Signed)
 Medication Instructions:  Your physician has recommended you make the following change in your medication:   -Increase amlodipine (norvasc) to 10mg  once daily.  *If you need a refill on your cardiac medications before your next appointment, please call your pharmacy*    Follow-Up: At [facility], you and your health needs are our priority.  As part of our continuing mission to provide you with exceptional heart care, we have created designated Provider Care Teams.  These Care Teams include your primary Cardiologist (physician) and Advanced Practice Providers (APPs -  Physician Assistants and Nurse Practitioners) who all work together to provide you with the care you need, when you need it.  We recommend signing up for the patient portal called "MyChart".  Sign up information is provided on this After Visit Summary.  MyChart is used to connect with patients for Virtual Visits (Telemedicine).  Patients are able to view lab/test results, encounter notes, upcoming appointments, etc.  Non-urgent messages can be sent to your provider as well.   To learn more about what you can do with MyChart, go to [email or web address].    Your next appointment:   4 week(s)  Provider:   Micah Flesher, PA-C, Marjie Skiff, PA-C, Robet Leu, PA-C, Azalee Course, PA-C, Bernadene Person, NP, or Reather Littler, NP       Then, Nanetta Batty, MD will plan to see you again in 12 month(s).     Other Instructions Dr. Allyson Sabal would like you to keep a daily blood pressure log. Please bring this to your follow up office visit.   If you monitor your blood pressure (BP) at home, please bring your BP cuff and your BP readings with you to this appointment  HOW TO TAKE YOUR BLOOD PRESSURE: Rest 5 minutes before taking your blood pressure. Don't smoke or drink caffeinated beverages for at least 30 minutes before. Take your blood pressure before (not after) you eat. Sit comfortably with your back supported and both  feet on the floor (don't cross your legs). Elevate your arm to heart level on a table or a desk. Use the proper sized cuff. It should fit smoothly and snugly around your bare upper arm. There should be enough room to slip a fingertip under the cuff. The bottom edge of the cuff should be 1 inch above the crease of the elbow. Ideally, take 3 measurements at one sitting and record the average.       1st Floor: - Lobby - Registration  - Pharmacy  - Lab - Cafe  2nd Floor: - PV Lab - Diagnostic Testing (echo, CT, nuclear med)  3rd Floor: - Vacant  4th Floor: - TCTS (cardiothoracic surgery) - AFib Clinic - Structural Heart Clinic - Vascular Surgery  - Vascular Ultrasound  5th Floor: - HeartCare Cardiology (general and EP) - Clinical Pharmacy for coumadin, hypertension, lipid, weight-loss medications, and med management appointments    Valet parking services will be available as well.

## 2024-01-29 NOTE — Progress Notes (Signed)
 01/29/2024 Lonnie Jackson   [birth year]  [id number]  Primary Physician [name], MD Primary Cardiologist: Runell Gess MD FACP, [place], [place], [place]  HPI:  Lonnie Jackson is a 68 y.o. moderately overweight divorced Caucasian male father of 2, grandfather 6 grandchildren who I last saw in the office 12/21/2022.  He works for [employer].  He was referred by Dr. Timothy Lasso for hypertension.  His cardiac risk factors include over 100-pack-year history tobacco abuse having quit 9 years ago.  He stopped drinking 40 years ago as well.  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  He had hypertension for the last 14 years since 2005.  He is on 3 antihypertensive medications.  2D echo performed 07/24/2017 revealed normal LV systolic function with moderate LVH.  He denies chest pain or shortness of breath.  Never had a heart attack or stroke.   He has been working with Baxter Hire in our hypertension clinic for medication titration.  He says that he feels excessively fatigued on a beta-blocker and wishes to discontinue this.  A 2D echo performed 07/24/2017 was entirely normal.   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  His stress level has been markedly reduced.  He walks 5 to 6 miles a day.  He is lost some weight.  His medications have been simplified now taking only olmesartan for hypertension and Crestor for hyperlipidemia.    [place] I saw [place] a year ago [place] because of recent diagnosis of polycythemia vera.  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  He does admit to some wheezing.  He denies chest pain.  He says he is under a lot of stress.  He is living with a new girlfriend who is 66 years younger than him in [place].     Current Meds  Medication Sig   amLODipine (NORVASC) 5 MG tablet Take 1 tablet (5 mg total) by mouth daily.   olmesartan  (BENICAR) 40 MG tablet TAKE 1 TABLET BY MOUTH EVERY DAY   rosuvastatin (CRESTOR) 10 MG tablet Take 10 mg by mouth daily.   testosterone cypionate (DEPOTESTOSTERONE CYPIONATE) 200 MG/ML injection Inject into the muscle every 14 (fourteen) days.     No Known Allergies  Social History   Socioeconomic History   Marital status: Married    Spouse name: Not on file   Number of children: Not on file   Years of education: Not on file   Highest education level: Not on file  Occupational History   Not on file  Tobacco Use   Smoking status: Former    Current packs/day: 0.00    Types: Cigarettes    Start date: 09/25/1969    Quit date: 09/25/2009    Years since quitting: 14.3   Smokeless tobacco: Never  Vaping Use   Vaping status: Never Used  Substance and Sexual Activity   Alcohol use: No   Drug use: No   Sexual activity: Not on file  Other Topics Concern   Not on file  Social History Narrative   Not on file   Social Drivers of Health   Financial Resource Strain: Not on file  Food Insecurity: Not on file  Transportation Needs: Not on file  Physical Activity: Not on file  Stress: Not on file  Social Connections: Unknown (03/16/2022)   Received  from [facility]   Social Network    Social Network: Not on file  Intimate Partner Violence: Not At Risk (10/12/2023)   Received from [facility]   HITS    Over the last 12 months how often did your partner physically hurt you?: Never    Over the last 12 months how often did your partner insult you or talk down to you?: Never    Over the last 12 months how often did your partner threaten you with physical harm?: Never    Over the last 12 months how often did your partner scream or curse at you?: Never     Review of Systems: General: negative for chills, fever, night sweats or weight changes.  Cardiovascular: negative for chest pain, dyspnea on exertion, edema, orthopnea, palpitations, paroxysmal nocturnal dyspnea or  shortness of breath Dermatological: negative for rash Respiratory: negative for cough or wheezing Urologic: negative for hematuria Abdominal: negative for nausea, vomiting, diarrhea, bright red blood per rectum, melena, or hematemesis Neurologic: negative for visual changes, syncope, or dizziness All other systems reviewed and are otherwise negative except as noted above.    Blood pressure (!) 144/110, pulse 67, height 5\' 10"  (1.778 m), weight 228 lb 6.4 oz (103.6 kg), SpO2 95%.  General appearance: alert and no distress Neck: no adenopathy, no carotid bruit, no JVD, supple, symmetrical, trachea midline, and thyroid not enlarged, symmetric, no tenderness/mass/nodules Lungs: clear to auscultation bilaterally Heart: regular rate and rhythm, S1, S2 normal, no murmur, click, rub or gallop Extremities: extremities normal, atraumatic, no cyanosis or edema Pulses: 2+ and symmetric Skin: Skin color, texture, turgor normal. No rashes or lesions Neurologic: Grossly normal  EKG EKG Interpretation Date/Time:  Wednesday January 29 2024 15:35:16 EDT Ventricular Rate:  67 PR Interval:  182 QRS Duration:  80 QT Interval:  374 QTC Calculation: 395 R Axis:   -13  Text Interpretation: Normal sinus rhythm Normal ECG When compared with ECG of 03-Oct-2017 12:56, No significant change was found Confirmed by Nanetta Batty [phone]) on 01/29/2024 3:49:53 PM    ASSESSMENT AND PLAN:   Essential hypertension History of essential hypertension her blood pressure measured today at 144/110.  He is on amlodipine and olmesartan.  He admits to being under a lot of stress.  I am going to increase his amlodipine from 5 to 10 mg a day.  Will have him come back to see an APP in 4 weeks to review.  Hyperlipidemia History of hyperlipidemia on rosuvastatin with lipid profile performed 12/18/2022 revealing total cholesterol 137, LDL 85 and HDL 31.     Runell Gess MD [facility] [place], [facility] 01/29/2024 3:56 PM

## 2024-01-30 ENCOUNTER — Other Ambulatory Visit (HOSPITAL_COMMUNITY): Payer: Self-pay

## 2024-02-11 ENCOUNTER — Other Ambulatory Visit (HOSPITAL_COMMUNITY): Payer: Self-pay

## 2024-02-11 MED ORDER — PREDNISOLONE ACETATE 1 % OP SUSP
1.0000 [drp] | Freq: Two times a day (BID) | OPHTHALMIC | 1 refills | Status: DC
  Filled 2024-02-11: qty 5, 50d supply, fill #0

## 2024-02-12 ENCOUNTER — Other Ambulatory Visit (HOSPITAL_COMMUNITY): Payer: Self-pay

## 2024-02-14 ENCOUNTER — Other Ambulatory Visit (HOSPITAL_COMMUNITY): Payer: Self-pay

## 2024-02-14 ENCOUNTER — Other Ambulatory Visit: Payer: Self-pay | Admitting: Cardiovascular Disease

## 2024-02-14 MED ORDER — OLMESARTAN MEDOXOMIL 40 MG PO TABS
40.0000 mg | ORAL_TABLET | Freq: Every day | ORAL | 3 refills | Status: DC
  Filled 2024-02-14 (×2): qty 90, 90d supply, fill #0
  Filled 2024-05-13 – 2024-05-14 (×2): qty 90, 90d supply, fill #1

## 2024-03-16 ENCOUNTER — Other Ambulatory Visit (HOSPITAL_COMMUNITY): Payer: Self-pay

## 2024-03-16 ENCOUNTER — Ambulatory Visit: Attending: Emergency Medicine | Admitting: Emergency Medicine

## 2024-03-16 ENCOUNTER — Encounter: Payer: Self-pay | Admitting: Emergency Medicine

## 2024-03-16 VITALS — BP 132/82 | HR 74 | Ht 70.0 in | Wt 228.0 lb

## 2024-03-16 DIAGNOSIS — I10 Essential (primary) hypertension: Secondary | ICD-10-CM | POA: Diagnosis present

## 2024-03-16 DIAGNOSIS — E782 Mixed hyperlipidemia: Secondary | ICD-10-CM | POA: Diagnosis present

## 2024-03-16 DIAGNOSIS — G4733 Obstructive sleep apnea (adult) (pediatric): Secondary | ICD-10-CM

## 2024-03-16 MED ORDER — HYDROCHLOROTHIAZIDE 25 MG PO TABS
25.0000 mg | ORAL_TABLET | Freq: Every day | ORAL | 3 refills | Status: AC
  Filled 2024-03-16: qty 90, 90d supply, fill #0
  Filled 2024-06-22: qty 90, 90d supply, fill #1
  Filled 2024-09-16: qty 90, 90d supply, fill #2

## 2024-03-16 NOTE — Progress Notes (Signed)
 Cardiology Office Note:    Date:  03/17/2024  ID:  Lonnie Jackson, DOB Sep 29, 1956, MRN [id number] PCP: Margarete Sharps, MD  [facility] Providers Cardiologist:  Lauro Portal, MD       Patient Profile:      Chief Complaint: Follow-up hypertension History of Present Illness:  Lonnie Jackson is a 68 y.o. male with visit-pertinent history of hypertension, tobacco abuse, hyperlipidemia, same  He was initially referred by his primary care provider for evaluation of hypertension.  He is a former smoker.  He is a former Interior and spatial designer.  He had a normal echocardiogram on 07/2017.  Since he has been seen by our pharmacy hypertension clinic for management of his hypertension.  He was last seen in office on 01/29/2024 by Dr. Katheryne Pane.  His blood pressure was 144/110.  His amlodipine  was increased to 5 to 10 mg a day.  He was continued on olmesartan  40 mg daily.  Previous antihypertensive Beta blocker - fatigue (Toprol 25mg ) Chlorthalidone  25mg  (hypotension) Amlodipine  10mg  (lower extremity swelling)   Discussed the use of AI scribe software for clinical note transcription with the patient, who gave verbal consent to proceed.  History of Present Illness Lonnie Jackson is a 68 year old male with hypertension who presents with concerns about medication side effects and blood pressure management.  Today patient is without any acute cardiovascular concerns or complaints.  Has not been taking his blood pressure daily at home since his amlodipine  was adjusted to 10 mg daily.  He notes he has been at bike week in [facility] all weekend. Fortunately his blood pressure today was controlled at 132/82.  He experiences ankle swelling after increasing his amlodipine  dosage from 5 mg to 10 mg. This swelling affects his daily activities. He is currently on amlodipine  10 mg and olmesartan  40 mg. His blood pressure was previously as high as 160/110 mmHg at home but is now 130/80 mmHg. He remains active in  powerlifting and maintains a busy lifestyle. He does not consume alcohol and is committed to maintaining his health through physical activity.  Review of systems:  Please see the history of present illness. All other systems are reviewed and otherwise negative.     Home Medications:    Current Meds  Medication Sig   hydrochlorothiazide (HYDRODIURIL) 25 MG tablet Take 1 tablet (25 mg total) by mouth daily.   olmesartan  (BENICAR ) 40 MG tablet Take 1 tablet (40 mg total) by mouth daily for blood pressure to replace losartan.   prednisoLONE  acetate (PRED FORTE ) 1 % ophthalmic suspension Place 1 drop into the left eye 2 (two) times daily.   rosuvastatin (CRESTOR) 10 MG tablet Take 10 mg by mouth daily.   testosterone  cypionate (DEPOTESTOSTERONE CYPIONATE) 200 MG/ML injection Inject into the muscle every 14 (fourteen) days.   [DISCONTINUED] amLODipine  (NORVASC ) 10 MG tablet Take 1 tablet (10 mg total) by mouth daily.         Studies Reviewed:   Echocardiogram 07/24/2017 - Left ventricle: The cavity size was normal. Wall thickness was    increased in a pattern of moderate LVH. Systolic function was    normal. The estimated ejection fraction was in the range of 60%    to 65%. Wall motion was normal; there were no regional wall    motion abnormalities. Left ventricular diastolic function    parameters were normal.  - Aortic valve: Dilated sinus of valsalva.  - Left atrium: The atrium was mildly dilated.  - Atrial septum:  No defect or patent foramen ovale was identified.  Risk Assessment/Calculations:         Physical Exam:   VS:  BP 132/82   Pulse 74   Ht 5\' 10"  (1.778 m)   Wt 228 lb (103.4 kg)   SpO2 94%   BMI 32.71 kg/m    Wt Readings from Last 3 Encounters:  03/16/24 228 lb (103.4 kg)  01/29/24 228 lb 6.4 oz (103.6 kg)  11/29/23 229 lb 3.2 oz (104 kg)    GEN: Well nourished, well developed in no acute distress NECK: No JVD; No carotid bruits CARDIAC: RRR, no murmurs, rubs,  gallops RESPIRATORY:  Clear to auscultation without rales, wheezing or rhonchi  ABDOMEN: Soft, non-tender, non-distended EXTREMITIES:  No edema; No acute deformity     Assessment and Plan:  Hypertension Blood pressure today is 132/82 and under adequate control During last OV amlodipine  was increased to 10 mg daily He notes today that he developed significant lower extremity swelling since increased amlodipine  dosage - Plan will be to discontinue amlodipine  and start hydrochlorothiazide 25 mg daily - Continue olmesartan  40 mg daily - Monitor blood pressure daily and alert office for consistent blood pressures greater than 140/85 - BMET x2 weeks   Hyperlipidemia LDL 85, HDL 31, TC 137 on 11/6107 LDL cholesterol under adequate control - Continue rosuvastatin 10 mg and  Obstructive sleep apnea - Remains compliant to CPAP      Dispo:  Return in about 3 months (around 06/16/2024).  Signed, Ava Boatman, NP

## 2024-03-16 NOTE — Patient Instructions (Signed)
 Medication Instructions:  STOP TAKING AMLODIPINE  10 MG DAILY.  START TAKING HYDROCHLOROTHIAZIDE 25 MG DAILY.   Lab Work: BMET TO BE DONE IN 2 WEEKS.  Testing/Procedures: NONE  Follow-Up: At [facility], you and your health needs are our priority.  As part of our continuing mission to provide you with exceptional heart care, our providers are all part of one team.  This team includes your primary Cardiologist (physician) and Advanced Practice Providers or APPs (Physician Assistants and Nurse Practitioners) who all work together to provide you with the care you need, when you need it.  Your next appointment:   3 month(s)  Provider:   Lauro Portal, MD OR Palmer Bobo, DNP

## 2024-03-17 ENCOUNTER — Encounter: Payer: Self-pay | Admitting: Emergency Medicine

## 2024-05-13 ENCOUNTER — Other Ambulatory Visit (HOSPITAL_COMMUNITY): Payer: Self-pay

## 2024-05-14 ENCOUNTER — Other Ambulatory Visit (HOSPITAL_COMMUNITY): Payer: Self-pay

## 2024-05-14 ENCOUNTER — Encounter: Payer: Self-pay | Admitting: Hematology and Oncology

## 2024-06-02 ENCOUNTER — Telehealth: Payer: Self-pay | Admitting: Hematology and Oncology

## 2024-06-02 ENCOUNTER — Telehealth: Payer: Self-pay | Admitting: *Deleted

## 2024-06-02 NOTE — Telephone Encounter (Signed)
 Per call with pt requesting need for an appt for phlebotomy due to issues occurring with the  [facility] not able to accommodate.  Pt's diagnosis is PCV.  He states since last visit in Jan 2025 he obtained 1 phleb thru the [facility] - but then somehow it was inappropriately noted that he was Hep C positive and therefore they declined further draws.   He has since clarified that he is not Hep C positive but is concerned due to delay in phlebotomy that he wants to obtain therapy at this office.  Of note pt does not have any follow up appts scheduled at this office.  Per above- lab and phleb appt request sent to scheduling as well as lab and visit within a month post for further evaluation and recommendations.  This note will be forwarded to provider to obtain order for phlebotomy.

## 2024-06-02 NOTE — Telephone Encounter (Signed)
 Spoke with patient confirming upcoming appointment

## 2024-06-04 ENCOUNTER — Other Ambulatory Visit: Payer: Self-pay | Admitting: *Deleted

## 2024-06-04 DIAGNOSIS — D751 Secondary polycythemia: Secondary | ICD-10-CM

## 2024-06-04 DIAGNOSIS — D45 Polycythemia vera: Secondary | ICD-10-CM

## 2024-06-05 ENCOUNTER — Inpatient Hospital Stay: Attending: Adult Health

## 2024-06-05 ENCOUNTER — Inpatient Hospital Stay

## 2024-06-05 ENCOUNTER — Other Ambulatory Visit (HOSPITAL_COMMUNITY): Payer: Self-pay

## 2024-06-05 ENCOUNTER — Telehealth: Payer: Self-pay | Admitting: Cardiovascular Disease

## 2024-06-05 VITALS — BP 169/111 | HR 66 | Temp 98.1°F | Resp 20 | Wt 234.4 lb

## 2024-06-05 DIAGNOSIS — D45 Polycythemia vera: Secondary | ICD-10-CM

## 2024-06-05 DIAGNOSIS — D751 Secondary polycythemia: Secondary | ICD-10-CM

## 2024-06-05 LAB — CBC WITH DIFFERENTIAL (CANCER CENTER ONLY)
Abs Immature Granulocytes: 0.04 K/uL (ref 0.00–0.07)
Basophils Absolute: 0.1 K/uL (ref 0.0–0.1)
Basophils Relative: 1 %
Eosinophils Absolute: 0.2 K/uL (ref 0.0–0.5)
Eosinophils Relative: 3 %
HCT: 48.9 % (ref 39.0–52.0)
Hemoglobin: 17.3 g/dL — ABNORMAL HIGH (ref 13.0–17.0)
Immature Granulocytes: 1 %
Lymphocytes Relative: 18 %
Lymphs Abs: 1.3 K/uL (ref 0.7–4.0)
MCH: 28.7 pg (ref 26.0–34.0)
MCHC: 35.4 g/dL (ref 30.0–36.0)
MCV: 81.2 fL (ref 80.0–100.0)
Monocytes Absolute: 0.7 K/uL (ref 0.1–1.0)
Monocytes Relative: 11 %
Neutro Abs: 4.8 K/uL (ref 1.7–7.7)
Neutrophils Relative %: 66 %
Platelet Count: 127 K/uL — ABNORMAL LOW (ref 150–400)
RBC: 6.02 MIL/uL — ABNORMAL HIGH (ref 4.22–5.81)
RDW: 13.4 % (ref 11.5–15.5)
WBC Count: 7.1 K/uL (ref 4.0–10.5)
nRBC: 0 % (ref 0.0–0.2)

## 2024-06-05 LAB — CMP (CANCER CENTER ONLY)
ALT: 37 U/L (ref 0–44)
AST: 31 U/L (ref 15–41)
Albumin: 4.4 g/dL (ref 3.5–5.0)
Alkaline Phosphatase: 51 U/L (ref 38–126)
Anion gap: 7 (ref 5–15)
BUN: 19 mg/dL (ref 8–23)
CO2: 30 mmol/L (ref 22–32)
Calcium: 9.6 mg/dL (ref 8.9–10.3)
Chloride: 100 mmol/L (ref 98–111)
Creatinine: 1.33 mg/dL — ABNORMAL HIGH (ref 0.61–1.24)
GFR, Estimated: 59 mL/min — ABNORMAL LOW (ref >60)
Glucose, Bld: 95 mg/dL (ref 70–99)
Potassium: 3.8 mmol/L (ref 3.5–5.1)
Sodium: 137 mmol/L (ref 135–145)
Total Bilirubin: 0.6 mg/dL (ref 0.0–1.2)
Total Protein: 7.5 g/dL (ref 6.5–8.1)

## 2024-06-05 LAB — FERRITIN: Ferritin: 16 ng/mL — ABNORMAL LOW (ref 24–336)

## 2024-06-05 NOTE — Telephone Encounter (Signed)
Attempted phone call to pt and left voicemail message to contact office at [phone]. 

## 2024-06-05 NOTE — Telephone Encounter (Signed)
 Spoke with pt who reports BP's have been elevated as below.  Pt checks his BP in the mornings before medication and at night when he returns home.  Pt encouraged to check BP this weekend and on Monday morning 2 hours after his morning medications and send readings through his MyChart.  Pt denies current CP, SOB, dizziness, edema, visual changes or weakness.  He has had some headache.  He endorses increased marital stress.  Pt does not add salt to his food but does eat out too much  Pt encouraged to follow a low Na+ diet and be mindful of salt content in foods he is taking in.  Pt states he has not been exercising as he is afraid to because of his elevated BP.  Pt advised walking could actually help with BP control and decrease stress level. Pt advised will forward to Dr Court for review and additional recommendation.  Pt verbalizes understanding and agrees with current plan.

## 2024-06-05 NOTE — Telephone Encounter (Signed)
 Pt returning nurse's call. Please advise

## 2024-06-05 NOTE — Patient Instructions (Signed)

## 2024-06-05 NOTE — Telephone Encounter (Signed)
 Pt c/o BP issue: STAT if pt c/o blurred vision, one-sided weakness or slurred speech.  STAT if BP is GREATER than 180/120 TODAY.  STAT if BP is LESS than 90/60 and SYMPTOMATIC TODAY  1. What is your BP concern?   Patient stated he is having a stressful time and his BP has been trending high  2. Have you taken any BP medication today?  Yes  3. What are your last 5 BP readings?  160/105 - around 5:00 am today  4. Are you having any other symptoms (ex. Dizziness, headache, blurred vision, passed out)?   Headache   Patient stated his BP readings have been trending high ranging from 150/95 to 167/111.  Patient thinks his medication needs to be adjusted.

## 2024-06-05 NOTE — Progress Notes (Signed)
 Pt. blood pressure elevated 165/111. Denies chest pain, dizziness, no shortness of breath noted. States he took his Benicar  and hydrochlorothiazide  at 0500 this am. Morna Kendall NP notified, no new orders, and ok to proceed with phlebotomy today. [name] presents today for phlebotomy per MD orders. Phlebotomy procedure started at 1606 and ended at 1614. 533 grams removed. Phlebotomy kit used. Patient observed for 30 minutes after procedure without any incident. Patient tolerated procedure well. IV needle removed intact. Food and fluids given. Pt. states he will re-check his blood pressure tonight. Will follow up with PCP if it continues to be elevated.

## 2024-06-08 NOTE — Telephone Encounter (Signed)
 Spoke with the patient and advised on recommendations per Dr. Court. Patient has an appointment on 8/13 and will bring his BP log to that appointment. He reports the following blood pressures over the weekend: 154/102, 152/98, and 148/91.

## 2024-06-09 ENCOUNTER — Telehealth: Payer: Self-pay | Admitting: Hematology and Oncology

## 2024-06-09 NOTE — Telephone Encounter (Signed)
 Called patient to reschedule his appt  with Dr. Loretha . Patient is aware of the upcoming appts

## 2024-06-17 ENCOUNTER — Encounter: Payer: Self-pay | Admitting: Cardiovascular Disease

## 2024-06-17 ENCOUNTER — Ambulatory Visit: Attending: Cardiovascular Disease | Admitting: Cardiovascular Disease

## 2024-06-17 ENCOUNTER — Other Ambulatory Visit (HOSPITAL_COMMUNITY): Payer: Self-pay

## 2024-06-17 VITALS — BP 140/100 | HR 95 | Ht 70.0 in | Wt 227.2 lb

## 2024-06-17 DIAGNOSIS — I10 Essential (primary) hypertension: Secondary | ICD-10-CM | POA: Diagnosis present

## 2024-06-17 DIAGNOSIS — E782 Mixed hyperlipidemia: Secondary | ICD-10-CM

## 2024-06-17 MED ORDER — HYDRALAZINE HCL 25 MG PO TABS
25.0000 mg | ORAL_TABLET | Freq: Three times a day (TID) | ORAL | 3 refills | Status: AC
  Filled 2024-06-17 (×2): qty 270, 90d supply, fill #0

## 2024-06-17 NOTE — Patient Instructions (Signed)
 Medication Instructions:  Your physician has recommended you make the following change in your medication:   -Start hydralazine  (apresoline ) 25mg  three times a day.   *If you need a refill on your cardiac medications before your next appointment, please call your pharmacy*  Testing/Procedures: Dr. Court has ordered a CT coronary calcium  score.   Test locations:  [facility] at [facility]  [facility] at [facility]  This is $99 out of pocket.   Coronary CalciumScan A coronary calcium  scan is an imaging test used to look for deposits of calcium  and other fatty materials (plaques) in the inner lining of the blood vessels of the heart (coronary arteries). These deposits of calcium  and plaques can partly clog and narrow the coronary arteries without producing any symptoms or warning signs. This puts a person at risk for a heart attack. This test can detect these deposits before symptoms develop. Tell a health care provider about: Any allergies you have. All medicines you are taking, including vitamins, herbs, eye drops, creams, and over-the-counter medicines. Any problems you or family members have had with anesthetic medicines. Any blood disorders you have. Any surgeries you have had. Any medical conditions you have. Whether you are pregnant or may be pregnant. What are the risks? Generally, this is a safe procedure. However, problems may occur, including: Harm to a pregnant woman and her unborn baby. This test involves the use of radiation. Radiation exposure can be dangerous to a pregnant woman and her unborn baby. If you are pregnant, you generally should not have this procedure done. Slight increase in the risk of cancer. This is because of the radiation involved in the test. What happens before the procedure? No preparation is needed for this procedure. What happens during the  procedure? You will undress and remove any jewelry around your neck or chest. You will put on a hospital gown. Sticky electrodes will be placed on your chest. The electrodes will be connected to an electrocardiogram (ECG) machine to record a tracing of the electrical activity of your heart. A CT scanner will take pictures of your heart. During this time, you will be asked to lie still and hold your breath for 2-3 seconds while a picture of your heart is being taken. The procedure may vary among health care providers and hospitals. What happens after the procedure? You can get dressed. You can return to your normal activities. It is up to you to get the results of your test. Ask your health care provider, or the department that is doing the test, when your results will be ready. Summary A coronary calcium  scan is an imaging test used to look for deposits of calcium  and other fatty materials (plaques) in the inner lining of the blood vessels of the heart (coronary arteries). Generally, this is a safe procedure. Tell your health care provider if you are pregnant or may be pregnant. No preparation is needed for this procedure. A CT scanner will take pictures of your heart. You can return to your normal activities after the scan is done. This information is not intended to replace advice given to you by your health care provider. Make sure you discuss any questions you have with your health care provider. Document Released: 04/19/2008 Document Revised: 09/10/2016 Document Reviewed: 09/10/2016 Elsevier Interactive Patient Education  2017 [employer].   Follow-Up: At [facility], you and your health needs are our priority.  As  part of our continuing mission to provide you with exceptional heart care, our providers are all part of one team.  This team includes your primary Cardiologist (physician) and Advanced Practice Providers or APPs (Physician Assistants and Nurse Practitioners) who all  work together to provide you with the care you need, when you need it.  Your next appointment:   6 month(s)  Provider:   Lum Louis, NP         Then, Dorn Lesches, MD will plan to see you again in 12 month(s).     We recommend signing up for the patient portal called MyChart.  Sign up information is provided on this After Visit Summary.  MyChart is used to connect with patients for Virtual Visits (Telemedicine).  Patients are able to view lab/test results, encounter notes, upcoming appointments, etc.  Non-urgent messages can be sent to your provider as well.   To learn more about what you can do with MyChart, go to [email or web address].   Other Instructions Dr. Lesches has requested that you schedule an appointment with one of our clinical pharmacists for a blood pressure check appointment within the next 5-6 weeks.  If you monitor your blood pressure (BP) at home, please bring your BP cuff and your BP readings with you to this appointment  HOW TO TAKE YOUR BLOOD PRESSURE: Rest 5 minutes before taking your blood pressure. Don't smoke or drink caffeinated beverages for at least 30 minutes before. Take your blood pressure before (not after) you eat. Sit comfortably with your back supported and both feet on the floor (don't cross your legs). Elevate your arm to heart level on a table or a desk. Use the proper sized cuff. It should fit smoothly and snugly around your bare upper arm. There should be enough room to slip a fingertip under the cuff. The bottom edge of the cuff should be 1 inch above the crease of the elbow. Ideally, take 3 measurements at one sitting and record the average.

## 2024-06-17 NOTE — Assessment & Plan Note (Signed)
 History of hyperlipidemia on low-dose rosuvastatin  with lipid profile performed 12/18/2022 revealing a total cholesterol 137, LDL of 85 and HDL of 31.  I am going to get a coronary calcium  score to risk stratify

## 2024-06-17 NOTE — Assessment & Plan Note (Signed)
 History of essential hypertension with blood pressure measured today at 140/100.  He showed me his blood pressure log which revealed diastolics in the mid to high 90s to low 100 range.  He is on hydrochlorothiazide  and olmesartan .  He has been on amlodipine  in the past which was stopped because of edema.  Is been on beta-blockers in the past which were stopped because of fatigue.  I am going to start low-dose hydralazine  25 mg p.o. 3 times daily and have him keep a 30-day blood pressure log after which she will see a Pharm.D. back in follow-up.

## 2024-06-17 NOTE — Progress Notes (Signed)
 06/17/2024 [name]   [birth year]  [id number]  Primary Physician Onita Rush, MD Primary Cardiologist: Dorn JINNY Lesches MD FACP, [place], [place], [place]  HPI:  Lonnie Jackson is a 68 y.o.   moderately overweight separated Caucasian male father of 2, grandfather 6 grandchildren who I last saw in the office 01/29/2024.    He was referred by Dr. Onita for hypertension.  He is accompanied by his daughter Alberta today.  His cardiac risk factors include over 100-pack-year history tobacco abuse having quit 20 years ago.  He stopped drinking 40 years ago as well.  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  He had hypertension for the last 14 years since 2005.  He is on 3 antihypertensive medications.  2D echo performed 07/24/2017 revealed normal LV systolic function with moderate LVH.  He denies chest pain or shortness of breath.  Never had a heart attack or stroke.   He has been working with Josette in our hypertension clinic for medication titration.  He says that he feels excessively fatigued on a beta-blocker and wishes to discontinue this.  A 2D echo performed 07/24/2017 was entirely normal.   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  His stress level has been markedly reduced.  He walks 5 to 6 miles a day.  He is lost some weight.  His medications have been simplified now taking only olmesartan  for hypertension and Crestor  for hyperlipidemia.    [place] because of recent diagnosis of polycythemia vera.  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  He does admit to some wheezing.  He denies chest pain.  He says he is under a lot of stress.  He is living with a new girlfriend who is 9 years younger than him in [place].  Since I saw him 6 months ago he continues to be under a lot of stress.  Blood pressure is continuously difficult to control.  He had  been on amlodipine  which were stopped because of edema.  His blood pressure log at home show diastolics in the mid to high 90s/low 100 range.  He otherwise denies chest pain or shortness of breath.  He was power lifting but now no longer works at [employer] and just walks.   Current Meds  Medication Sig   hydrochlorothiazide  (HYDRODIURIL ) 25 MG tablet Take 1 tablet (25 mg total) by mouth daily.   olmesartan  (BENICAR ) 40 MG tablet TAKE 1 TABLET BY MOUTH EVERY DAY   rosuvastatin  (CRESTOR ) 10 MG tablet Take 10 mg by mouth daily.   testosterone  cypionate (DEPOTESTOSTERONE CYPIONATE) 200 MG/ML injection Inject into the muscle every 14 (fourteen) days.     No Known Allergies  Social History   Socioeconomic History   Marital status: Married    Spouse name: Not on file   Number of children: Not on file   Years of education: Not on file   Highest education level: Not on file  Occupational History   Not on file  Tobacco Use   Smoking status: Former    Current packs/day: 0.00    Types: Cigarettes    Start date: 09/25/1969    Quit date: 09/25/2009    Years since quitting: 14.7   Smokeless tobacco: Never  Vaping Use   Vaping status: Never Used  Substance and Sexual Activity  Alcohol use: No   Drug use: No   Sexual activity: Not on file  Other Topics Concern   Not on file  Social History Narrative   Not on file   Social Drivers of Health   Financial Resource Strain: Not on file  Food Insecurity: Not on file  Transportation Needs: Not on file  Physical Activity: Not on file  Stress: Not on file  Social Connections: Unknown (03/16/2022)   Received from [facility]   Social Network    Social Network: Not on file  Intimate Partner Violence: Not At Risk (10/12/2023)   Received from [facility]   HITS    Over the last 12 months how often did your partner physically hurt you?: Never    Over the last 12 months how often did your partner insult you or talk down to you?: Never     Over the last 12 months how often did your partner threaten you with physical harm?: Never    Over the last 12 months how often did your partner scream or curse at you?: Never     Review of Systems: General: negative for chills, fever, night sweats or weight changes.  Cardiovascular: negative for chest pain, dyspnea on exertion, edema, orthopnea, palpitations, paroxysmal nocturnal dyspnea or shortness of breath Dermatological: negative for rash Respiratory: negative for cough or wheezing Urologic: negative for hematuria Abdominal: negative for nausea, vomiting, diarrhea, bright red blood per rectum, melena, or hematemesis Neurologic: negative for visual changes, syncope, or dizziness All other systems reviewed and are otherwise negative except as noted above.    Blood pressure (!) 140/100, pulse 95, height 5' 10 (1.778 m), weight 227 lb 3.2 oz (103.1 kg), SpO2 94%.  General appearance: alert and no distress Neck: no adenopathy, no carotid bruit, no JVD, supple, symmetrical, trachea midline, and thyroid  not enlarged, symmetric, no tenderness/mass/nodules Lungs: clear to auscultation bilaterally Heart: regular rate and rhythm, S1, S2 normal, no murmur, click, rub or gallop Extremities: extremities normal, atraumatic, no cyanosis or edema Pulses: 2+ and symmetric Skin: Skin color, texture, turgor normal. No rashes or lesions Neurologic: Grossly normal  EKG not performed today      ASSESSMENT AND PLAN:   Essential hypertension History of essential hypertension with blood pressure measured today at 140/100.  He showed me his blood pressure log which revealed diastolics in the mid to high 90s to low 100 range.  He is on hydrochlorothiazide  and olmesartan .  He has been on amlodipine  in the past which was stopped because of edema.  Is been on beta-blockers in the past which were stopped because of fatigue.  I am going to start low-dose hydralazine  25 mg p.o. 3 times daily and have him keep a  30-day blood pressure log after which she will see a Pharm.D. back in follow-up.  Hyperlipidemia History of hyperlipidemia on low-dose rosuvastatin  with lipid profile performed 12/18/2022 revealing a total cholesterol 137, LDL of 85 and HDL of 31.  I am going to get a coronary calcium  score to risk stratify     Dorn DOROTHA Lesches MD [facility] 06/17/2024 4:20 PM

## 2024-06-23 ENCOUNTER — Ambulatory Visit (HOSPITAL_COMMUNITY)
Admission: RE | Admit: 2024-06-23 | Discharge: 2024-06-23 | Disposition: A | Discharge status: Home / Self Care | Payer: Self-pay | Source: Ambulatory Visit | Attending: Internal Medicine | Admitting: Internal Medicine

## 2024-06-23 DIAGNOSIS — E782 Mixed hyperlipidemia: Secondary | ICD-10-CM | POA: Insufficient documentation

## 2024-06-23 DIAGNOSIS — Z136 Encounter for screening for cardiovascular disorders: Secondary | ICD-10-CM | POA: Insufficient documentation

## 2024-06-23 DIAGNOSIS — I700 Atherosclerosis of aorta: Secondary | ICD-10-CM | POA: Insufficient documentation

## 2024-06-24 ENCOUNTER — Other Ambulatory Visit (HOSPITAL_COMMUNITY): Payer: Self-pay

## 2024-06-24 ENCOUNTER — Ambulatory Visit: Payer: Self-pay | Admitting: Cardiovascular Disease

## 2024-06-24 DIAGNOSIS — E782 Mixed hyperlipidemia: Secondary | ICD-10-CM

## 2024-06-24 MED ORDER — ROSUVASTATIN CALCIUM 20 MG PO TABS
20.0000 mg | ORAL_TABLET | Freq: Every day | ORAL | 3 refills | Status: AC
Start: 2024-06-24 — End: ?
  Filled 2024-06-24 – 2024-07-16 (×2): qty 90, 90d supply, fill #0
  Filled 2024-10-15: qty 90, 90d supply, fill #1

## 2024-06-26 ENCOUNTER — Inpatient Hospital Stay: Admitting: Hematology and Oncology

## 2024-06-26 ENCOUNTER — Other Ambulatory Visit: Payer: Self-pay

## 2024-06-26 ENCOUNTER — Inpatient Hospital Stay

## 2024-06-26 DIAGNOSIS — D45 Polycythemia vera: Secondary | ICD-10-CM

## 2024-06-29 ENCOUNTER — Encounter: Payer: Self-pay | Admitting: Hematology and Oncology

## 2024-06-29 ENCOUNTER — Inpatient Hospital Stay

## 2024-06-29 ENCOUNTER — Inpatient Hospital Stay (HOSPITAL_BASED_OUTPATIENT_CLINIC_OR_DEPARTMENT_OTHER): Admitting: Hematology and Oncology

## 2024-06-29 VITALS — BP 142/97 | HR 85 | Temp 98.3°F | Resp 19 | Wt 226.7 lb

## 2024-06-29 DIAGNOSIS — D45 Polycythemia vera: Secondary | ICD-10-CM | POA: Diagnosis not present

## 2024-06-29 DIAGNOSIS — D751 Secondary polycythemia: Secondary | ICD-10-CM

## 2024-06-29 LAB — CBC WITH DIFFERENTIAL (CANCER CENTER ONLY)
Abs Immature Granulocytes: 0.02 10*3/uL (ref 0.00–0.07)
Basophils Absolute: 0.1 10*3/uL (ref 0.0–0.1)
Basophils Relative: 1 %
Eosinophils Absolute: 0.2 10*3/uL (ref 0.0–0.5)
Eosinophils Relative: 3 %
HCT: 46.8 % (ref 39.0–52.0)
Hemoglobin: 16.4 g/dL (ref 13.0–17.0)
Immature Granulocytes: 0 %
Lymphocytes Relative: 21 %
Lymphs Abs: 1.5 10*3/uL (ref 0.7–4.0)
MCH: 29.5 pg (ref 26.0–34.0)
MCHC: 35 g/dL (ref 30.0–36.0)
MCV: 84.3 fL (ref 80.0–100.0)
Monocytes Absolute: 0.7 10*3/uL (ref 0.1–1.0)
Monocytes Relative: 11 %
Neutro Abs: 4.4 10*3/uL (ref 1.7–7.7)
Neutrophils Relative %: 64 %
Platelet Count: 158 10*3/uL (ref 150–400)
RBC: 5.55 MIL/uL (ref 4.22–5.81)
RDW: 13.2 % (ref 11.5–15.5)
WBC Count: 6.9 10*3/uL (ref 4.0–10.5)
nRBC: 0 % (ref 0.0–0.2)
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
## 2024-07-07 ENCOUNTER — Other Ambulatory Visit (HOSPITAL_COMMUNITY): Payer: Self-pay

## 2024-07-10 ENCOUNTER — Ambulatory Visit: Attending: Pharmacist | Admitting: Pharmacist
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
## 2024-07-14 ENCOUNTER — Other Ambulatory Visit (HOSPITAL_COMMUNITY): Payer: Self-pay

## 2024-07-16 ENCOUNTER — Other Ambulatory Visit (HOSPITAL_COMMUNITY): Payer: Self-pay

## 2024-07-20 ENCOUNTER — Other Ambulatory Visit (HOSPITAL_COMMUNITY): Payer: Self-pay

## 2024-07-20 MED ORDER — TRAZODONE HCL 50 MG PO TABS
75.0000 mg | ORAL_TABLET | Freq: Every day | ORAL | 3 refills | Status: AC
  Filled 2024-07-20: qty 135, 90d supply, fill #0

## 2024-08-19 ENCOUNTER — Other Ambulatory Visit (HOSPITAL_COMMUNITY): Payer: Self-pay

## 2024-08-19 ENCOUNTER — Other Ambulatory Visit: Payer: Self-pay

## 2024-08-19 MED ORDER — OLMESARTAN MEDOXOMIL 40 MG PO TABS
40.0000 mg | ORAL_TABLET | Freq: Every day | ORAL | 3 refills | Status: AC
  Filled 2024-08-19: qty 90, 90d supply, fill #0
  Filled 2024-11-13: qty 90, 90d supply, fill #1

## 2024-09-16 ENCOUNTER — Other Ambulatory Visit (HOSPITAL_COMMUNITY): Payer: Self-pay

## 2024-09-21 ENCOUNTER — Ambulatory Visit
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
## 2024-10-20 ENCOUNTER — Telehealth: Payer: Self-pay | Admitting: Hematology and Oncology

## 2024-10-20 NOTE — Telephone Encounter (Signed)
 Left patient a voicemail regarding 12/28/24 appt being rescheduled to 12/30/24.

## 2024-11-14 ENCOUNTER — Encounter: Payer: Self-pay | Admitting: Hematology and Oncology

## 2024-11-14 ENCOUNTER — Other Ambulatory Visit (HOSPITAL_COMMUNITY): Payer: Self-pay

## 2024-11-16 ENCOUNTER — Ambulatory Visit: Attending: Internal Medicine | Admitting: Pharmacist

## 2024-11-16 VITALS — BP 140/100 | HR 80

## 2024-11-16 DIAGNOSIS — I10 Essential (primary) hypertension: Secondary | ICD-10-CM | POA: Insufficient documentation

## 2024-11-16 NOTE — Assessment & Plan Note (Signed)
 Assessment: Blood pressure elevated No home reading available Patient not compliant with hydralazine  Drinking a lot of caffeine Very active, but hasn't been to the gym in awhile  Plan: Take hydralazine  TID, first thing in AM, mid day and at bedtime Continue olmesartan  and hydrochlorothiazide  Decrease caffeine Resume going to the gym Check bp at home Follow up in 6 weeks

## 2024-11-16 NOTE — Patient Instructions (Signed)
 Please start taking hydralazine  25mg  three times a day, first thing in the morning, mid day and at bedtime  Please continue olmesartan  40mg  daily and hydrochlorothiazide  25mg  daily  Please cut back on caffeine and start back at the gym  Your blood pressure goal is < 130/71mmHg    Important lifestyle changes to control high blood pressure  Intervention  Effect on the BP   Weight loss Weight loss is one of the most effective lifestyle changes for controlling blood pressure. If you're overweight or obese, losing even a small amount of weight can help reduce blood pressure.    Blood pressure can decrease by 1 millimeter of mercury (mmHg) with each kilogram (about 2.2 pounds) of weight lost.   Exercise regularly As a general goal, aim for 30 minutes of moderate physical activity every day.    Regular physical activity can lower blood pressure by 5 - 8 mmHg.   Eat a healthy diet Eat a diet rich in whole grains, fruits, vegetables, lean meat, and low-fat dairy products. Limit processed foods, saturated fat, and sweets.    A heart-healthy diet can lower high blood pressure by 10 mmHg.   Reduce salt (sodium) in your diet Aim for 000mg  of sodium each day. Avoid deli meats, canned food, and frozen microwave meals which are high in sodium.     Limiting sodium can reduce blood pressure by 5 mmHg.   Limit alcohol One drink equals 12 ounces of beer, 5 ounces of wine, or 1.5 ounces of 80-proof liquor.    Limiting alcohol to < 1 drink a day for women or < 2 drinks a day for men can help lower blood pressure by about 4 mmHg.   To check your pressure at home you will need to:   Sit up in a chair, with feet flat on the floor and back supported. Do not cross your ankles or legs. Rest your left arm so that the cuff is about heart level. If the cuff goes on your upper arm, then just relax your arm on the table, arm of the chair, or your lap. If you have a wrist cuff, hold your wrist against  your chest at heart level. Place the cuff snugly around your arm, about 1 inch above the crease of your elbow. The cords should be inside the groove of your elbow.  Sit quietly, with the cuff in place, for about 5 minutes. Then press the power button to start a reading. Do not talk or move while the reading is taking place.  Record your readings on a sheet of paper. Although most cuffs have a memory, it is often easier to see a pattern developing when the numbers are all in front of you.  You can repeat the reading after 1-3 minutes if it is recommended.   Make sure your bladder is empty and you have not had caffeine or tobacco within the last 30 minutes   Always bring your blood pressure log with you to your appointments. If you have not brought your monitor in to be double checked for accuracy, please bring it to your next appointment.   You can find a list of validated (accurate) blood pressure cuffs at: validatebp.org
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
## 2024-12-08 ENCOUNTER — Encounter: Payer: Self-pay | Admitting: Hematology and Oncology

## 2024-12-28 ENCOUNTER — Other Ambulatory Visit

## 2024-12-28 ENCOUNTER — Ambulatory Visit: Admitting: Hematology and Oncology

## 2024-12-30 ENCOUNTER — Inpatient Hospital Stay

## 2024-12-30 ENCOUNTER — Inpatient Hospital Stay: Admitting: Hematology and Oncology

## 2025-01-01 ENCOUNTER — Ambulatory Visit: Admitting: Pharmacist
